# Patient Record
Sex: Male | Born: 1990 | Race: White | Hispanic: No | Marital: Single | Smoking: Never smoker
Health system: Southern US, Community
[De-identification: ages and names within clinical notes are randomized; demographics above are authoritative.]

## PROBLEM LIST (undated history)

## (undated) DIAGNOSIS — M2140 Flat foot [pes planus] (acquired), unspecified foot: Secondary | ICD-10-CM

## (undated) DIAGNOSIS — K219 Gastro-esophageal reflux disease without esophagitis: Secondary | ICD-10-CM

## (undated) DIAGNOSIS — F419 Anxiety disorder, unspecified: Secondary | ICD-10-CM

## (undated) HISTORY — DX: Anxiety disorder, unspecified: F41.9

## (undated) HISTORY — DX: Gastro-esophageal reflux disease without esophagitis: K21.9

## (undated) HISTORY — DX: Flat foot (pes planus) (acquired), unspecified foot: M21.40

---
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
## 2013-12-06 ENCOUNTER — Emergency Department: Payer: Self-pay | Admitting: Emergency Medicine

## 2014-10-30 ENCOUNTER — Other Ambulatory Visit: Payer: Self-pay | Admitting: Family Medicine

## 2014-10-30 DIAGNOSIS — K4090 Unilateral inguinal hernia, without obstruction or gangrene, not specified as recurrent: Secondary | ICD-10-CM

## 2014-11-08 ENCOUNTER — Ambulatory Visit
Admission: RE | Admit: 2014-11-08 | Discharge: 2014-11-08 | Disposition: A | Discharge status: Home / Self Care | Payer: BLUE CROSS/BLUE SHIELD | Source: Ambulatory Visit | Attending: Family Medicine | Admitting: Family Medicine

## 2014-11-08 DIAGNOSIS — R198 Other specified symptoms and signs involving the digestive system and abdomen: Secondary | ICD-10-CM | POA: Diagnosis present

## 2014-11-08 DIAGNOSIS — R1904 Left lower quadrant abdominal swelling, mass and lump: Secondary | ICD-10-CM | POA: Insufficient documentation

## 2014-11-08 DIAGNOSIS — K4090 Unilateral inguinal hernia, without obstruction or gangrene, not specified as recurrent: Secondary | ICD-10-CM

## 2015-03-01 ENCOUNTER — Encounter: Payer: Self-pay | Admitting: Urology

## 2015-03-01 ENCOUNTER — Ambulatory Visit (INDEPENDENT_AMBULATORY_CARE_PROVIDER_SITE_OTHER): Payer: BLUE CROSS/BLUE SHIELD | Admitting: Urology

## 2015-03-01 VITALS — BP 145/73 | HR 72 | Ht 70.0 in | Wt 210.6 lb

## 2015-03-01 DIAGNOSIS — N508 Other specified disorders of male genital organs: Secondary | ICD-10-CM

## 2015-03-01 DIAGNOSIS — N5089 Other specified disorders of the male genital organs: Secondary | ICD-10-CM

## 2015-03-01 NOTE — Progress Notes (Signed)
03/01/2015 9:21 AM   Arthur [name] [birth year] [id number]  Referring provider: Oswaldo Conroy, MD [address] [place], [place] [address]  Chief Complaint  Patient presents with  . Left groin pain and bulge     x 2 months referred by [name]    HPI: Patient is a 24 year old white male who is referred to [place] by his PCP, Dr. Hessie Diener, for left groin and bulge with a negative groin ultrasound for further evaluation.    He states that the groin pain and swelling started 3 months ago. He initially assumed it was a hernia, he states the bulge was first seen in the inguinal canal. Then, it is migrated down into the scrotum. The swelling increases as the day progresses and by  evening the swelling is very severe, but when he wakes up in the morning the swelling has abated. He has a burning sensation in the left groin on a continuous basis.  He's had some increase of urinary frequency, because he feels that this relieves the groin pain when he urinates.  Today, he feels the burn is present. He states that the swelling is not present at this time.  He has not had any gross hematuria, suprapubic pain or dysuria. He also denies any fevers, chills, nausea or vomiting.  PMH: Past Medical History  Diagnosis Date  . Acid reflux   . Anxiety     Surgical History: Past Surgical History  Procedure Laterality Date  . Ankle surgery  2010    Home Medications:    Medication List       This list is accurate as of: 03/01/15 11:59 PM.  Always use your most recent med list.               citalopram 40 MG tablet  Commonly known as:  CELEXA  Take 40 mg by mouth daily.     omeprazole 40 MG capsule  Commonly known as:  PRILOSEC  Take 40 mg by mouth daily.        Allergies: No Known Allergies  Family History: Family History  Problem Relation Age of Onset  . Prostate cancer Paternal Grandfather   . Kidney disease Neg Hx   . Kidney Stones      Social  History:  reports that he has never smoked. He does not have any smokeless tobacco history on file. He reports that he drinks alcohol. He reports that he uses illicit drugs.  ROS: UROLOGY Frequent Urination?: No Hard to postpone urination?: No Burning/pain with urination?: No Get up at night to urinate?: No Leakage of urine?: No Urine stream starts and stops?: No Trouble starting stream?: Yes Do you have to strain to urinate?: No Blood in urine?: No Urinary tract infection?: No Sexually transmitted disease?: No Injury to kidneys or bladder?: No Painful intercourse?: Yes Weak stream?: No Erection problems?: No Penile pain?: No  Gastrointestinal Nausea?: No Vomiting?: No Indigestion/heartburn?: No Diarrhea?: Yes Constipation?: No  Constitutional Fever: No Night sweats?: No Weight loss?: No Fatigue?: No  Skin Skin rash/lesions?: No Itching?: No  Eyes Blurred vision?: No Double vision?: No  Ears/Nose/Throat Sore throat?: No Sinus problems?: No  Hematologic/Lymphatic Swollen glands?: No Easy bruising?: No  Cardiovascular Leg swelling?: No Chest pain?: Yes  Respiratory Cough?: No Shortness of breath?: No  Endocrine Excessive thirst?: No  Musculoskeletal Back pain?: No Joint pain?: No  Neurological Headaches?: No Dizziness?: No  Psychologic Depression?: No Anxiety?: Yes  Physical Exam: BP 145/73 mmHg  Pulse 72  Ht  (1.778 m)  Wt 210 lb 9.6 oz (95.528 kg)  BMI 30.22 kg/m2  Constitutional:  Alert and oriented, No acute distress. HEENT: [place] AT, moist mucus membranes.  Trachea midline, no masses. Cardiovascular: No clubbing, cyanosis, or edema. Respiratory: Normal respiratory effort, no increased work of breathing. GI: Abdomen is soft, nontender, nondistended, no abdominal masses GU: No CVA tenderness. GU: Patient with circumcised phallus.  Urethral meatus is patent.  No penile discharge. No penile lesions or rashes. Scrotum without  lesions, cysts, rashes and/or edema.  Testicles are located scrotally bilaterally. No masses are appreciated in the testicles. Left and right epididymis are normal.  I could not appreciate a hernia at this time. Skin: No rashes, bruises or suspicious lesions. Lymph: No cervical or inguinal adenopathy. Neurologic: Grossly intact, no focal deficits, moving all 4 extremities. Psychiatric: Normal mood and affect.  Laboratory Data:  Pertinent Imaging: CLINICAL DATA: Evaluate for left inguinal hernia ; left groin pain for 6 weeks ago with intermittent bulging of the soft tissues in this region.  EXAM: ULTRASOUND left LOWER EXTREMITY LIMITED, groin region  TECHNIQUE: Ultrasound examination of the left groin soft tissues was performed in the area of clinical concern.  COMPARISON: None  FINDINGS: Interrogation of the left groin in the region of the inguinal crease reveals no cystic or solid mass. No peristalsis in bowel was observed.  IMPRESSION: There is no inguinal hernia demonstrated on this directed ultrasound exam.   Electronically Signed  By: David Swaziland M.D.  On: 11/08/2014 17:15        Assessment & Plan:    1. Scrotal swelling:   Patient is very frustrated that I could not feel any abnormalities on today exam.  I explained to him that it would be best if we could obtain a scrotal ultrasound when he had the swelling.  I will schedule the ultrasound in the late afternoon to increase our chances of the patient having the swelling.  I stated it could be a hydrocele, varicocele or a hernia.  I explained to the patient that he could shine a flashlight on his scrotum when he has the swelling and see if it transluminates.  This would inidicate a hydrocele.    - [place] Art/Ven Flow Abd Pelv Doppler; Future - US Scrotum; Future   Return for Scrotal ultrasound report.  Michiel Cowboy, PA-C  [facility] [address], [address] [place], [place] [address] [phone]

## 2015-03-02 DIAGNOSIS — N5089 Other specified disorders of the male genital organs: Secondary | ICD-10-CM | POA: Insufficient documentation

## 2015-03-08 ENCOUNTER — Ambulatory Visit
Admission: RE | Admit: 2015-03-08 | Discharge: 2015-03-08 | Disposition: A | Discharge status: Home / Self Care | Payer: BLUE CROSS/BLUE SHIELD | Source: Ambulatory Visit | Attending: Urology | Admitting: Urology

## 2015-03-08 DIAGNOSIS — N508 Other specified disorders of male genital organs: Secondary | ICD-10-CM | POA: Diagnosis present

## 2015-03-08 DIAGNOSIS — N5089 Other specified disorders of the male genital organs: Secondary | ICD-10-CM

## 2015-03-13 ENCOUNTER — Encounter: Payer: Self-pay | Admitting: Urology

## 2015-03-13 ENCOUNTER — Ambulatory Visit: Payer: BLUE CROSS/BLUE SHIELD | Admitting: Urology

## 2015-06-05 ENCOUNTER — Encounter: Payer: Self-pay | Admitting: Emergency Medicine

## 2015-06-05 DIAGNOSIS — Z79899 Other long term (current) drug therapy: Secondary | ICD-10-CM | POA: Diagnosis not present

## 2015-06-05 DIAGNOSIS — R1904 Left lower quadrant abdominal swelling, mass and lump: Secondary | ICD-10-CM | POA: Diagnosis present

## 2015-06-05 DIAGNOSIS — K4090 Unilateral inguinal hernia, without obstruction or gangrene, not specified as recurrent: Secondary | ICD-10-CM | POA: Insufficient documentation

## 2015-06-05 NOTE — ED Notes (Signed)
Pt presents to ED with painless swelling to the left side of his scrotum and reports he feels a hard "mass" inside. Pt reports this has been an ongoing issue and has been seen by his pcp in addition to having 2 ultrasounds which have been inconclusive. Pt reports swelling used to be intermittent but the past month or so has become constant. Pt was instructed to come to ED when swelling returned.

## 2015-06-06 ENCOUNTER — Emergency Department
Admission: EM | Admit: 2015-06-06 | Discharge: 2015-06-06 | Disposition: A | Discharge status: Home / Self Care | Payer: BLUE CROSS/BLUE SHIELD | Attending: Emergency Medicine | Admitting: Emergency Medicine

## 2015-06-06 DIAGNOSIS — K4090 Unilateral inguinal hernia, without obstruction or gangrene, not specified as recurrent: Secondary | ICD-10-CM

## 2015-06-06 NOTE — ED Provider Notes (Signed)
[name] Provider Note  ____________________________________________  Time seen: 3:20 AM  I have reviewed the triage vital signs and the nursing notes.   HISTORY  Chief Complaint Groin Swelling and Mass      HPI Arthur [name] is a 24 y.o. male  Presents with left groin swelling which she is noted for several months and has been seen multiple times for the same. Patient denies any testicular pain no fever no dysuria no nausea vomiting or diarrhea.      Past Medical History  Diagnosis Date  . Acid reflux   . Anxiety     Patient Active Problem List   Diagnosis Date Noted  . Scrotal swelling 03/02/2015    Past Surgical History  Procedure Laterality Date  . Ankle surgery  2010    Current Outpatient Rx  Name  Route  Sig  Dispense  Refill  . citalopram (CELEXA) 40 MG tablet   Oral   Take 40 mg by mouth daily.         Marland Kitchen omeprazole (PRILOSEC) 40 MG capsule   Oral   Take 40 mg by mouth daily.           Allergies Review of patient's allergies indicates no known allergies.  Family History  Problem Relation Age of Onset  . Prostate cancer Paternal Grandfather   . Kidney disease Neg Hx   . Kidney Stones      Social History Social History  Substance Use Topics  . Smoking status: Never Smoker   . Smokeless tobacco: None  . Alcohol Use: 0.0 oz/week    0 Standard drinks or equivalent per week    Review of Systems  Constitutional: Negative for fever. Eyes: Negative for visual changes. ENT: Negative for sore throat. Cardiovascular: Negative for chest pain. Respiratory: Negative for shortness of breath. Gastrointestinal: Negative for abdominal pain, vomiting and diarrhea. Genitourinary: Negative for dysuria. Positive for left groin swelling Musculoskeletal: Negative for back pain. Skin: Negative for rash. Neurological: Negative for headaches, focal weakness or numbness.  10-point ROS otherwise  negative.  ____________________________________________   PHYSICAL EXAM:  VITAL SIGNS: ED Triage Vitals  Enc Vitals Group     BP 06/05/15 2312 125/68 mmHg     Pulse Rate 06/05/15 2312 86     Resp 06/06/15 0320 16     Temp 06/05/15 2312 97.8 F (36.6 C)     Temp Source 06/05/15 2312 Oral     SpO2 06/05/15 2312 96 %     Weight 06/05/15 2312 210 lb (95.255 kg)     Height 06/05/15 2312  (1.778 m)     Head Cir --      Peak Flow --      Pain Score 06/05/15 2312 0     Pain Loc --      Pain Edu? --      Excl. in GC? --      Constitutional: Alert and oriented. Well appearing and in no distress. Eyes: Conjunctivae are normal. PERRL. Normal extraocular movements. ENT   Head: Normocephalic and atraumatic.   Nose: No congestion/rhinnorhea.   Mouth/Throat: Mucous membranes are moist.   Neck: No stridor. Hematological/Lymphatic/Immunilogical: No cervical lymphadenopathy. Cardiovascular: Normal rate, regular rhythm. Normal and symmetric distal pulses are present in all extremities. No murmurs, rubs, or gallops. Respiratory: Normal respiratory effort without tachypnea nor retractions. Breath sounds are clear and equal bilaterally. No wheezes/rales/rhonchi. Gastrointestinal: Soft and nontender. No distention. There is no CVA tenderness. Left  inguinal hernia appreciated on exam easily reduced Genitourinary: deferred Musculoskeletal: Nontender with normal range of motion in all extremities. No joint effusions.  No lower extremity tenderness nor edema. Neurologic:  Normal speech and language. No gross focal neurologic deficits are appreciated. Speech is normal.  Skin:  Skin is warm, dry and intact. No rash noted. Psychiatric: Mood and affect are normal. Speech and behavior are normal. Patient exhibits appropriate insight and judgment.     INITIAL IMPRESSION / ASSESSMENT AND PLAN / ED COURSE  Pertinent labs & imaging results that were available during my care of the  patient were reviewed by me and considered in my medical decision making (see chart for details).  History of physical exam consistent with left inguinal hernia such patient will be referred to Dr. Orvis BrillLoflin general surgeon on-call for further evaluation and management on the outpatient setting.  ____________________________________________   FINAL CLINICAL IMPRESSION(S) / ED DIAGNOSES  Final diagnoses:  Left inguinal hernia      Arthur Currentandolph N Brown, MD 06/06/15 0401

## 2015-06-06 NOTE — ED Notes (Signed)
Pt reports swelling/mass to left groin area.  Pt states sx for several months.  Pt has had multiple u/s and reports negative results.  No diff urinating. No penile discharge.   No back pain.  No n/v/d.

## 2015-06-06 NOTE — ED Notes (Signed)
Dr. Brown at bedside to eval.

## 2015-06-06 NOTE — Discharge Instructions (Signed)

## 2015-12-18 ENCOUNTER — Other Ambulatory Visit: Payer: Self-pay | Admitting: Family Medicine

## 2015-12-18 DIAGNOSIS — F419 Anxiety disorder, unspecified: Secondary | ICD-10-CM | POA: Insufficient documentation

## 2015-12-18 DIAGNOSIS — N5082 Scrotal pain: Secondary | ICD-10-CM

## 2015-12-26 ENCOUNTER — Ambulatory Visit: Admission: RE | Admit: 2015-12-26 | Payer: BLUE CROSS/BLUE SHIELD | Source: Ambulatory Visit

## 2016-01-02 ENCOUNTER — Ambulatory Visit: Admission: RE | Admit: 2016-01-02 | Payer: BLUE CROSS/BLUE SHIELD | Source: Ambulatory Visit

## 2016-08-19 IMAGING — US US EXTREM LOW*L* LIMITED
1 series · 14 of 18 positions shown · non-contrast
Comparison: None

CLINICAL DATA: Evaluate for left inguinal hernia ; left groin pain
for 6 weeks ago with intermittent bulging of the soft tissues in
this region.

EXAM:
ULTRASOUND left LOWER EXTREMITY LIMITED, groin region
TECHNIQUE: Ultrasound examination of the left groin soft tissues was performed
in the area of clinical concern.

[Series 1: us extrem low*left* limited · 0.08mm/px · 14 of 18 slices shown]
[im 1/18]
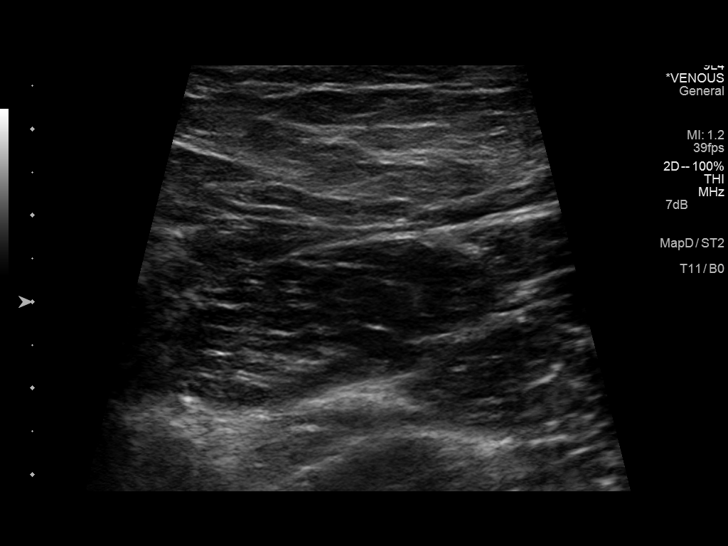
[im 2/18]
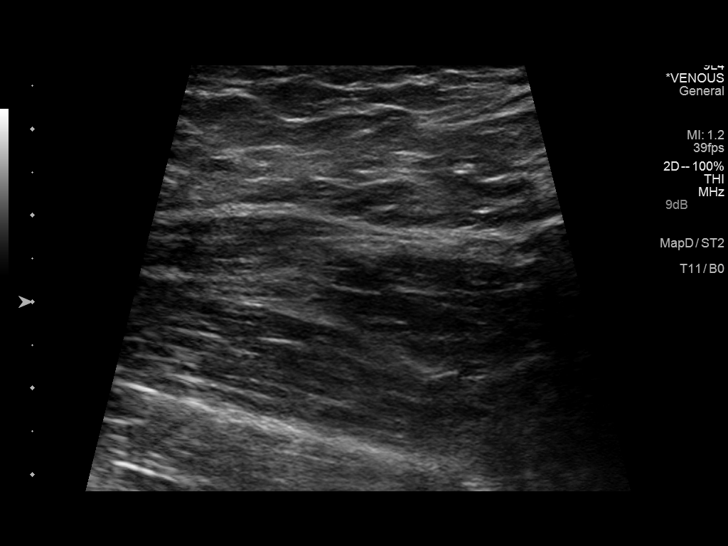
[im 4/18]
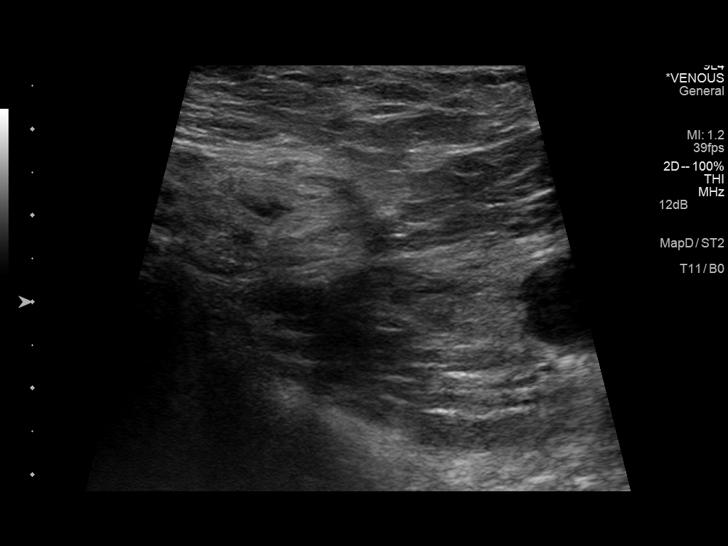
[im 5/18]
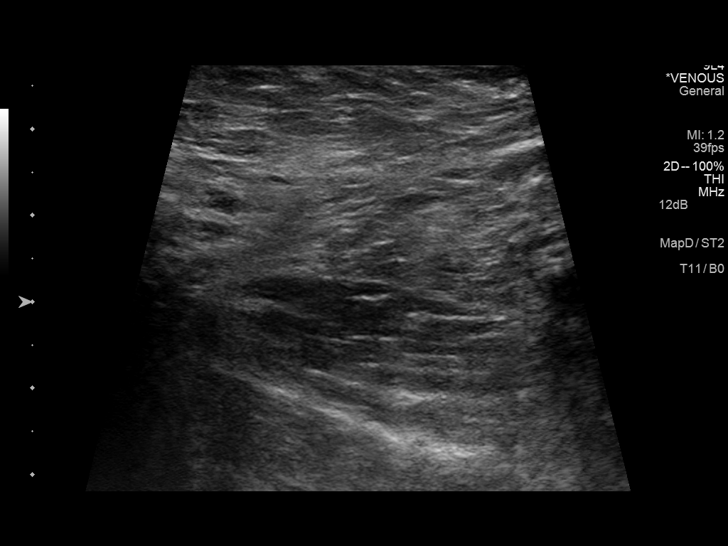
[im 6/18]
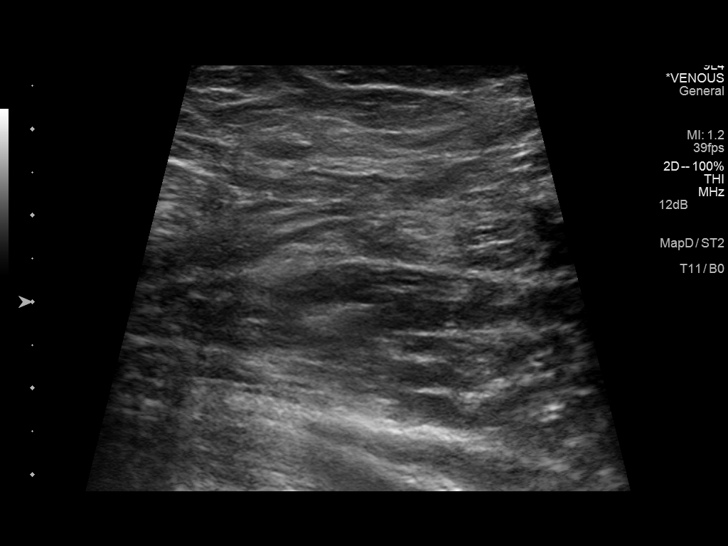
[im 8/18]
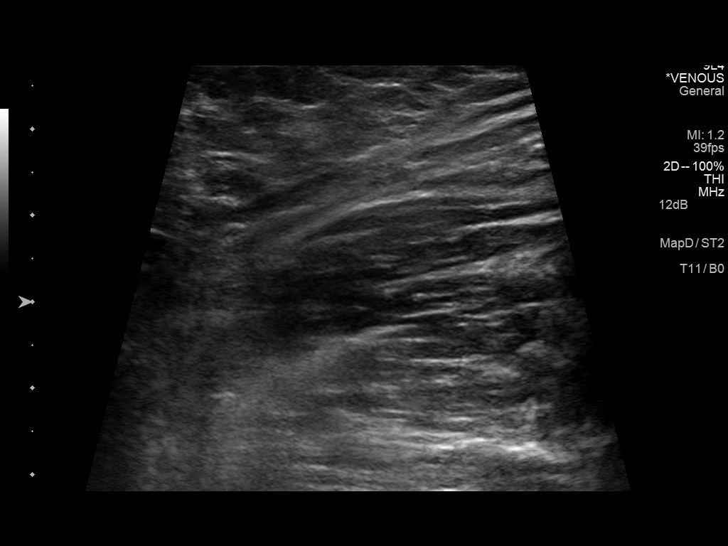
[im 9/18]
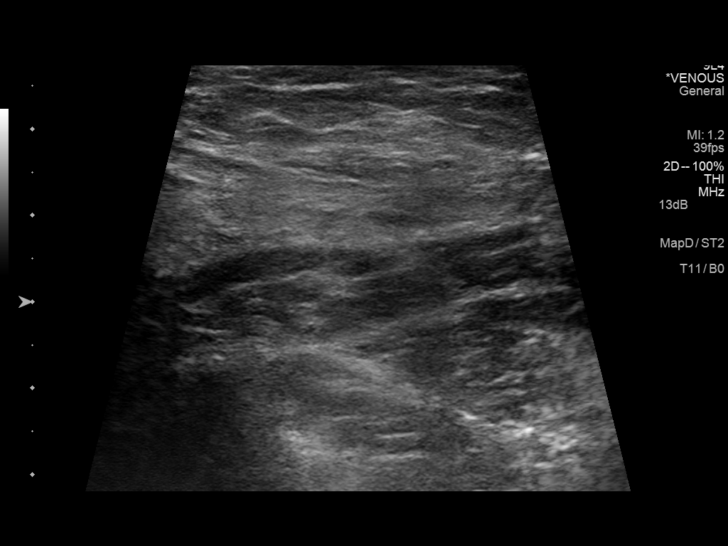
[im 10/18]
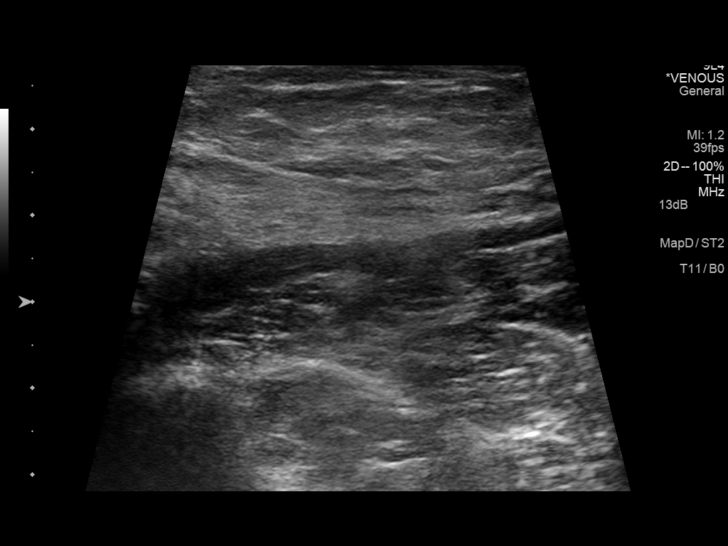
[im 11/18]
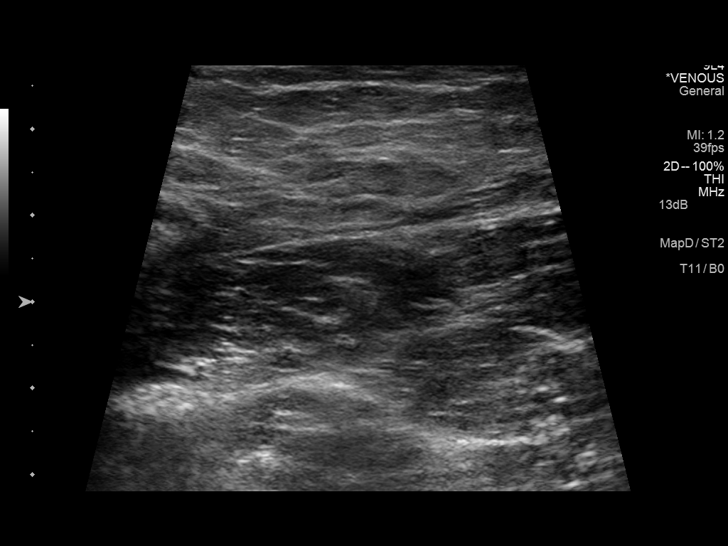
[im 13/18]
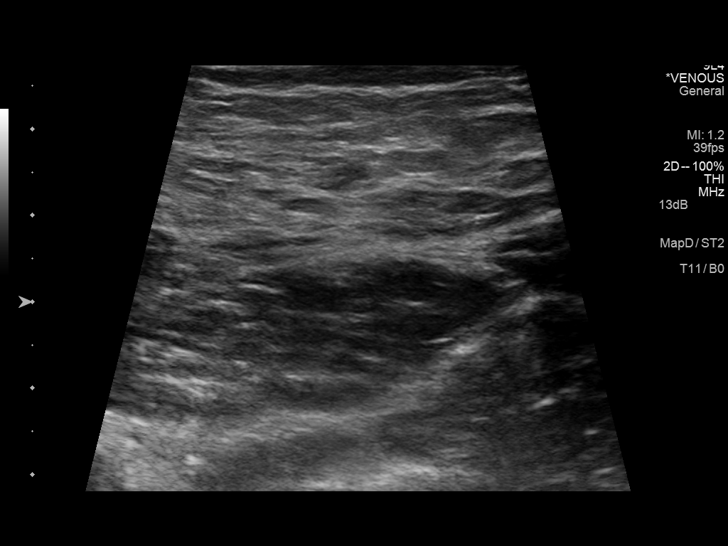
[im 14/18]
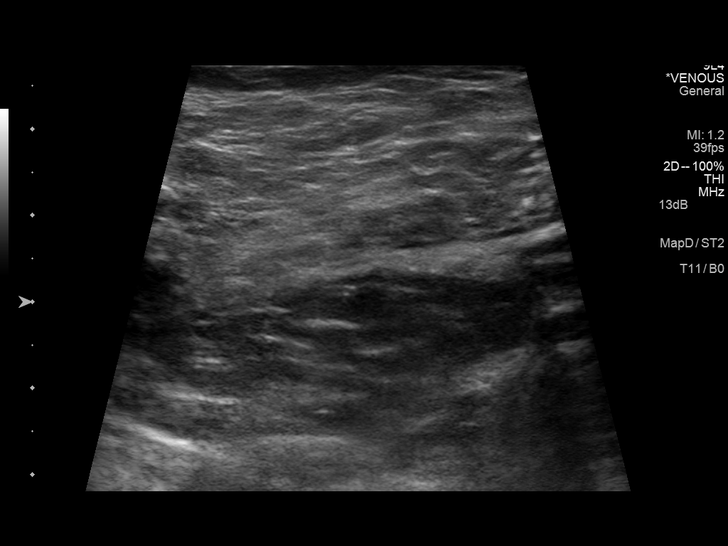
[im 15/18]
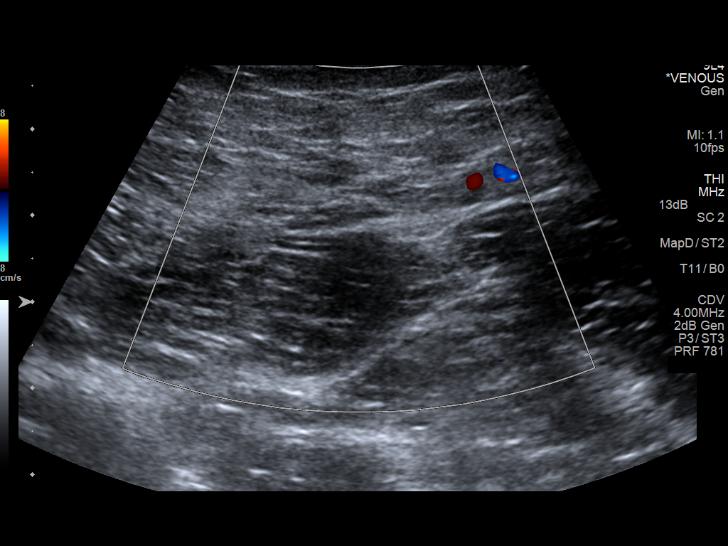
[im 17/18]
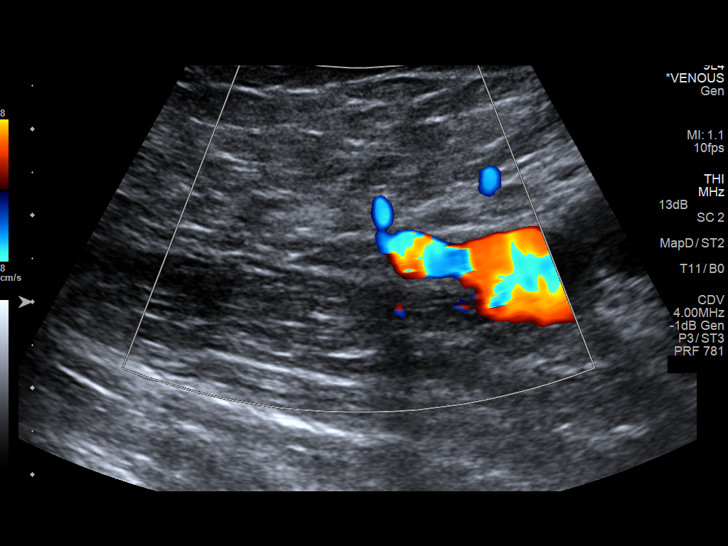
[im 18/18]
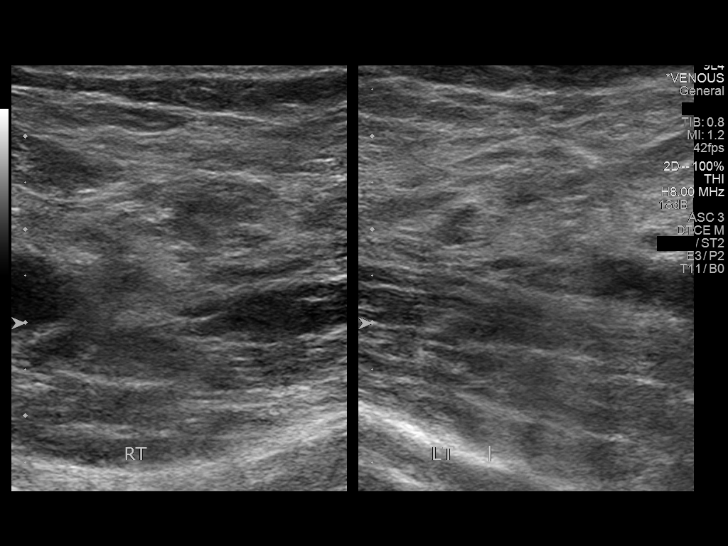

[14 of 18 positions shown; findings below may reference images not displayed]

FINDINGS: Interrogation of the left groin in the region of the inguinal crease
reveals no cystic or solid mass. No peristalsis in bowel was
observed.
IMPRESSION: There is no inguinal hernia demonstrated on this directed ultrasound
exam.

## 2016-12-17 IMAGING — US US ART/VEN ABD/PELV/SCROTUM DOPPLER LTD
1 series · 13 of 25 positions shown · non-contrast
Comparison: Left inguinal hernia evaluation by ultrasound [DATE]

CLINICAL DATA: Left scrotal swelling for the past 3 months, burning
sensation

EXAM:
SCROTAL ULTRASOUND
DOPPLER ULTRASOUND OF THE TESTICLES
TECHNIQUE: Complete ultrasound examination of the testicles, epididymis, and
other scrotal structures was performed. Color and spectral Doppler
ultrasound were also utilized to evaluate blood flow to the
testicles.

[Series 1: us art/ven abd/pelv/scrotum doppler ltd · 0.08mm/px · 13 of 73 slices shown]
[im 1/73]
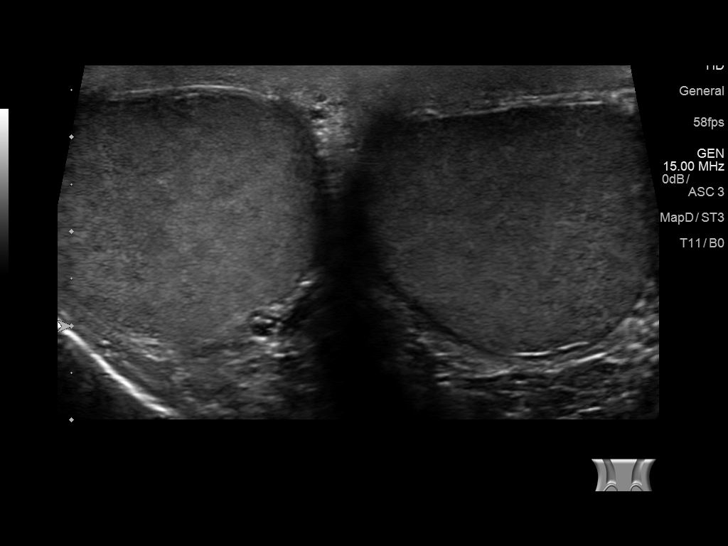
[im 7/73]
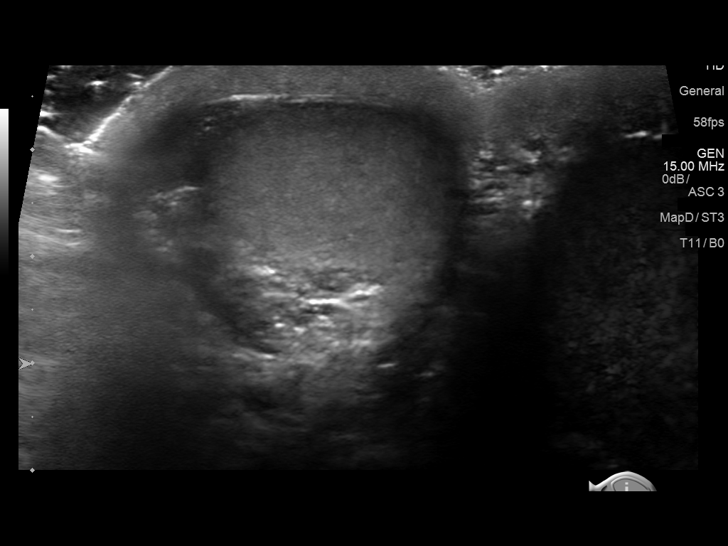
[im 13/73]
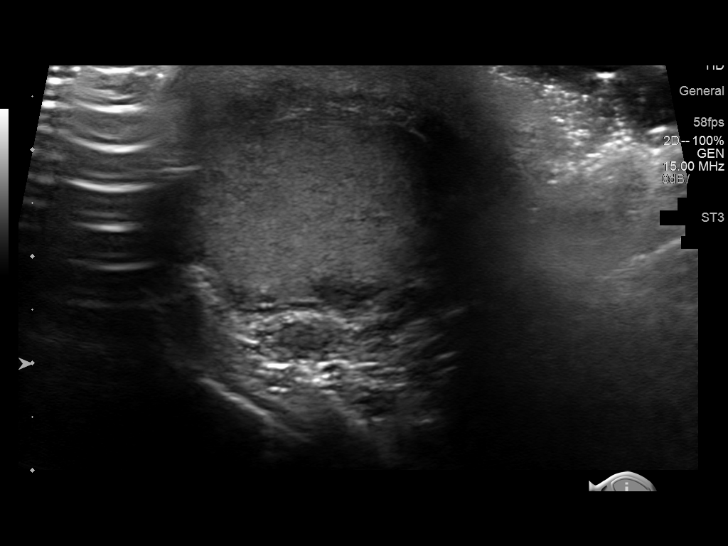
[im 19/73]
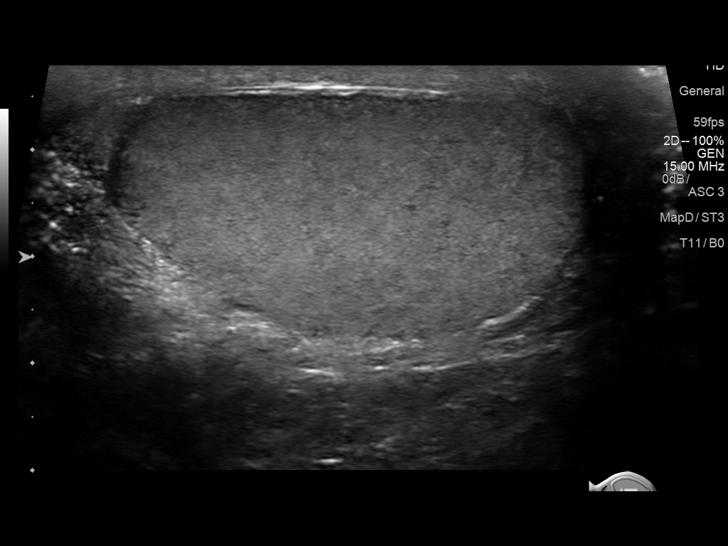
[im 25/73]
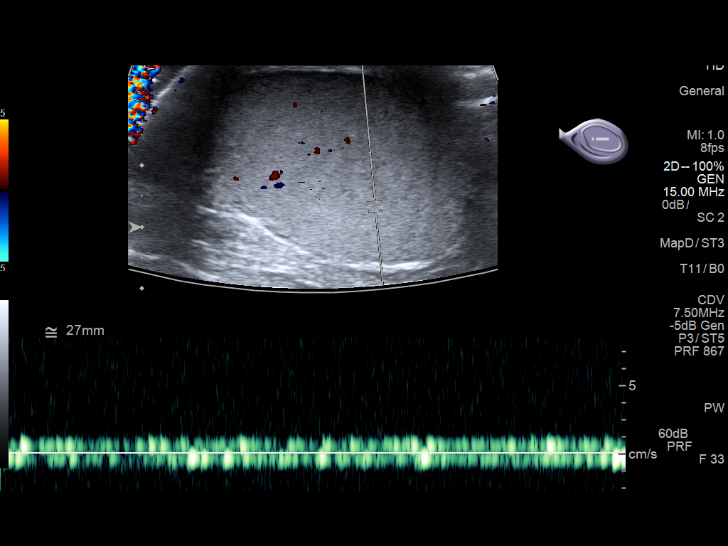
[im 31/73]
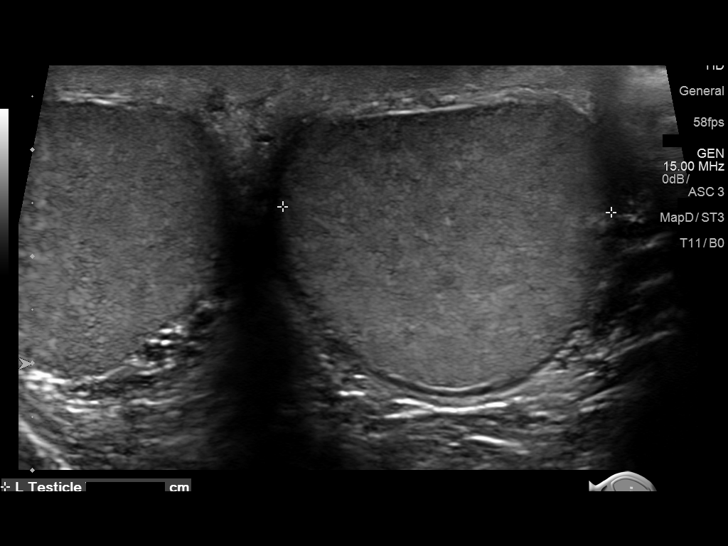
[im 37/73]
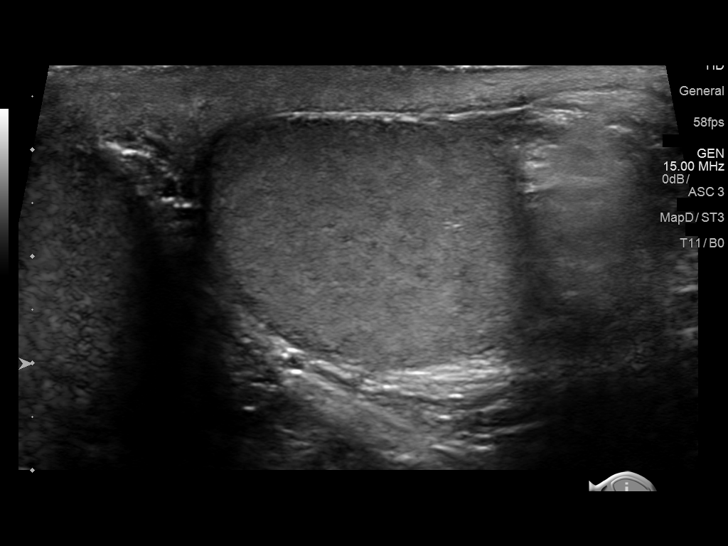
[im 43/73]
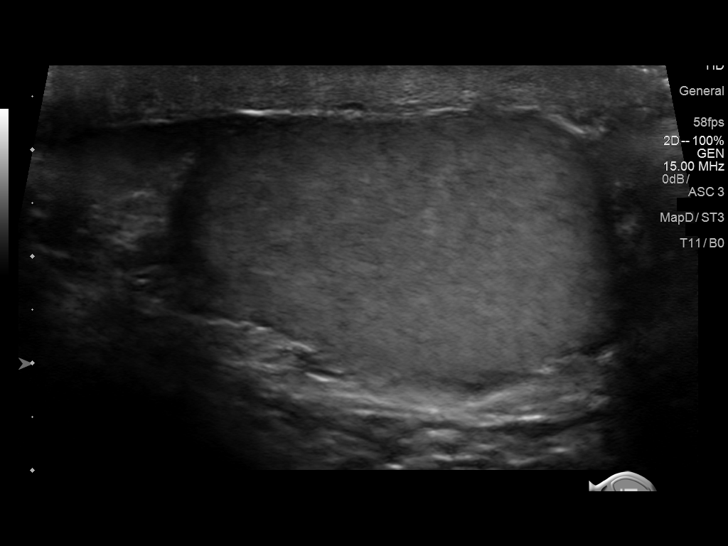
[im 49/73]
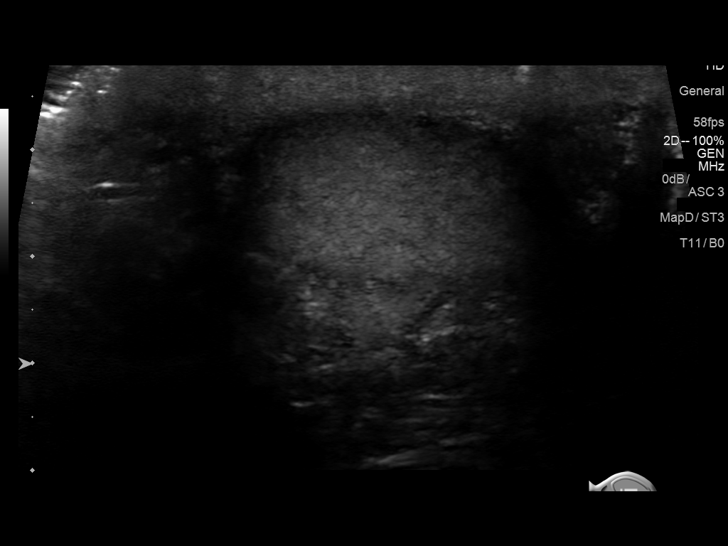
[im 55/73]
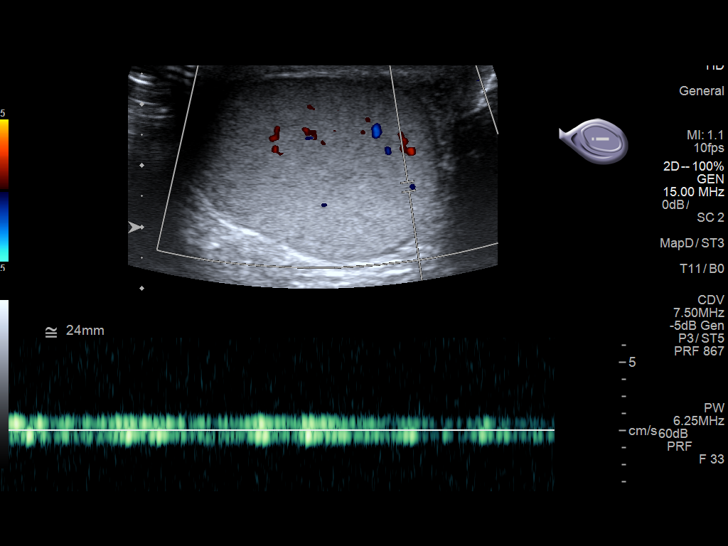
[im 61/73]
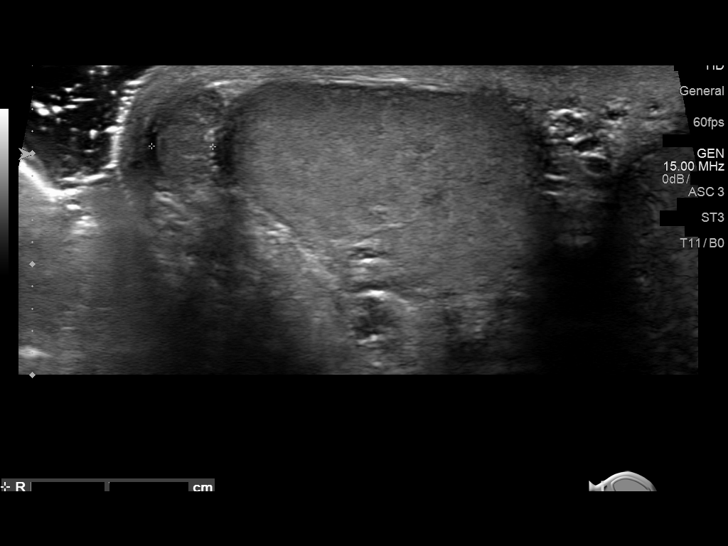
[im 67/73]
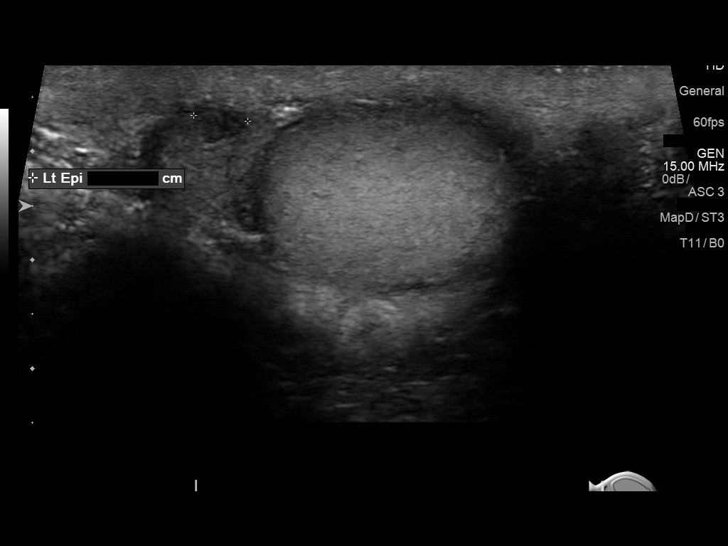
[im 73/73]
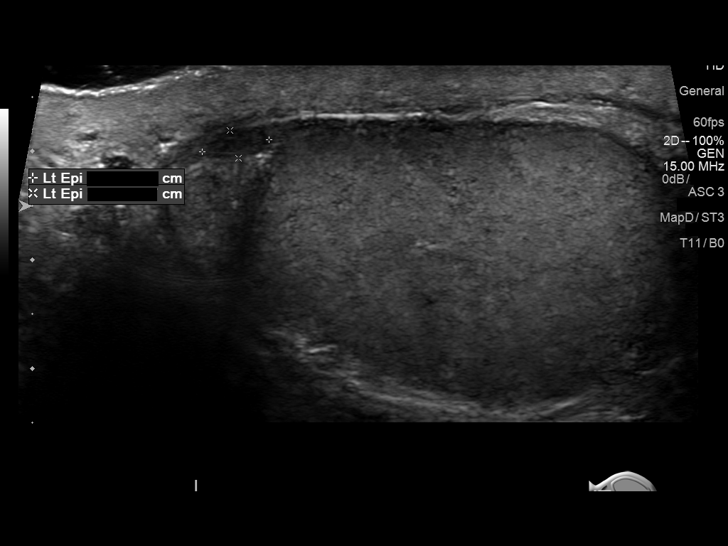

[13 of 25 positions shown; findings below may reference images not displayed]

FINDINGS: Right testicle

Measurements: 4.6 x 2.8 x 3.3 cm. No mass or microlithiasis
visualized.

Left testicle

Measurements: 4.4 x 2.5 x 3.1 cm. No mass or microlithiasis
visualized.

Right epididymis:  Normal in size and appearance.

Left epididymis: The epididymis on the left exhibits a heterogeneous
echotexture with an area of decreased echogenicity likely reflecting
a 6 x 5 x 3 mm complex cyst. It is not hypervascular

Hydrocele:  None visualized.

Varicocele:  None visualized.

Pulsed Doppler interrogation of both testes demonstrates normal low
resistance arterial and venous waveforms bilaterally.
IMPRESSION: 1. There is no evidence of a testicular mass, torsion, nor
inflammatory process.
2. Heterogeneous echotexture of the left epididymis. It does not
appear hypervascular however. This could reflect a low-level
inflammatory process. Is there a history of trauma?
3. There is no varicocele nor hydrocele.

## 2019-08-26 ENCOUNTER — Ambulatory Visit (INDEPENDENT_AMBULATORY_CARE_PROVIDER_SITE_OTHER): Payer: Commercial Managed Care - PPO

## 2019-08-26 ENCOUNTER — Ambulatory Visit (INDEPENDENT_AMBULATORY_CARE_PROVIDER_SITE_OTHER): Payer: Commercial Managed Care - PPO | Admitting: Podiatry

## 2019-08-26 ENCOUNTER — Other Ambulatory Visit: Payer: Self-pay | Admitting: Podiatry

## 2019-08-26 ENCOUNTER — Ambulatory Visit: Payer: Commercial Managed Care - PPO

## 2019-08-26 ENCOUNTER — Other Ambulatory Visit: Payer: Self-pay

## 2019-08-26 DIAGNOSIS — M2141 Flat foot [pes planus] (acquired), right foot: Secondary | ICD-10-CM

## 2019-08-26 DIAGNOSIS — M79671 Pain in right foot: Secondary | ICD-10-CM | POA: Diagnosis not present

## 2019-08-26 DIAGNOSIS — M2142 Flat foot [pes planus] (acquired), left foot: Secondary | ICD-10-CM

## 2019-08-26 DIAGNOSIS — M25371 Other instability, right ankle: Secondary | ICD-10-CM

## 2019-08-26 MED ORDER — AMMONIUM LACTATE 12 % EX LOTN: 1.0000 "application " | TOPICAL_LOTION | CUTANEOUS | 0 refills | Status: AC | PRN

## 2019-08-30 ENCOUNTER — Telehealth: Payer: Self-pay | Admitting: Podiatry

## 2019-08-30 ENCOUNTER — Ambulatory Visit: Payer: Commercial Managed Care - PPO | Admitting: Podiatry

## 2019-08-30 ENCOUNTER — Other Ambulatory Visit: Payer: Self-pay

## 2019-08-30 ENCOUNTER — Encounter: Payer: Self-pay | Admitting: Podiatry

## 2019-08-30 DIAGNOSIS — M2142 Flat foot [pes planus] (acquired), left foot: Secondary | ICD-10-CM

## 2019-08-30 DIAGNOSIS — M2140 Flat foot [pes planus] (acquired), unspecified foot: Secondary | ICD-10-CM

## 2019-08-30 DIAGNOSIS — M2141 Flat foot [pes planus] (acquired), right foot: Secondary | ICD-10-CM | POA: Diagnosis not present

## 2019-08-30 DIAGNOSIS — M25371 Other instability, right ankle: Secondary | ICD-10-CM | POA: Diagnosis not present

## 2019-08-30 MED ORDER — MELOXICAM 15 MG PO TABS: 15.0000 mg | ORAL_TABLET | Freq: Every day | ORAL | 3 refills | Status: DC

## 2019-08-30 MED ORDER — METHYLPREDNISOLONE 4 MG PO TBPK: ORAL_TABLET | ORAL | 0 refills | Status: DC

## 2019-08-30 MED ORDER — CYCLOBENZAPRINE HCL 10 MG PO TABS: 10.0000 mg | ORAL_TABLET | Freq: Three times a day (TID) | ORAL | 0 refills | Status: DC | PRN

## 2019-08-30 NOTE — Telephone Encounter (Signed)
It was already sent to that pharmacy.

## 2019-08-30 NOTE — Addendum Note (Signed)
Addended by: Alphia Kava D on: 08/30/2019 04:23 PM   Modules accepted: Orders

## 2019-08-30 NOTE — Telephone Encounter (Signed)
I looked it wrong. I thought I did send it to the [facility] [address] initially... its fixed now!!

## 2019-08-30 NOTE — Telephone Encounter (Signed)
I informed pt the medications had been reordered with the [facility]87.

## 2019-08-30 NOTE — Progress Notes (Signed)
He presents today after seeing Dr. Allena Katz last week.  He has a history of flatfoot which we corrected to a very satisfactory alignment back in 2008.  He is now having pain all over the right foot and in the lower leg.  He denies any trauma.  Objective: Vital signs are stable he is alert and oriented x3.  Evaluation of the right lower extremity demonstrates an osseously mature individual with collapse of the medial longitudinal arch particularly the rear foot and plantarflexion of the first metatarsal with eversion of the foot.  Once we try to correct this he is pretty much in a spasm along the peroneals and exquisitely painful.  Assessment: Corrected rear foot deformity and midfoot deformity when he was younger and is now developed peroneal spastic flatfoot which may have been overlooked previously or did not exist at the time.  Plan: Discussed etiology pathology conservative or surgical therapies at this point were going to request neurology to perform Botox injections along the peroneal muscles to help relax those muscles so that he could pull this foot back up underneath him.  He also has a very tight gastroc muscle which they may inject as well.  If this works for him that we need to see about getting him into a new orthotic.  Otherwise it will be reconstruction which I will send to Dr.Evans to reconstruct his rear foot and midfoot.  Most likely will have to disable the peroneals at that time.  I am also putting him on Flexeril to help with the tension in the legs starting on methylprednisolone to be followed by meloxicam.  I will follow-up with him once neurology has completed their injection therapy.

## 2019-08-30 NOTE — Progress Notes (Signed)
Subjective:  Patient ID: Arthur [name], male    DOB: 09/15/1990,  MRN: [id number]  Chief Complaint  Patient presents with  . Foot Problem    Pt states having pain from walking on outside of foot. Pt states he had surgery done by Dr. Milinda Pointer in 2008 but is unsure of the name of the procedure or his diagnosis. Pt states he believes the surgery was not effective and he still has pain.    29 y.o. male presents with the above complaint.  Patient presents with follow-up from flatfoot reconstruction that was done by Dr. Milinda Pointer in 2008 to the right foot.  Patient states that there is still some pain associated in the arch of the foot and it appears clinically that the foot has become flat.  He states that he has started walking differently which causing him a lot of pain.  He states that he has instability in the ankle that is causing him to have antalgic gait and is making it harder for him to work/walk.  He states most of his pain is in the ankle as well as in the arch of the foot.  He also has a secondary complaint of xerosis with fissure right side of the foot due to constant stress in the shoe given that he is rolling his foot inward.  Patient has tried applying creams which has not helped.  He has not tried anything else.  He is following up because he would like to know if there is anything else that could be done to decrease his pain.   Review of Systems: Negative except as noted in the HPI. Denies N/V/F/Ch.  Past Medical History:  Diagnosis Date  . Acid reflux   . Anxiety     Current Outpatient Medications:  .  cyclobenzaprine (FLEXERIL) 10 MG tablet, Take 10 mg by mouth at bedtime., Disp: , Rfl:  .  promethazine (PHENERGAN) 25 MG tablet, Take 25 mg by mouth every 6 (six) hours as needed., Disp: , Rfl:  .  ammonium lactate (AMLACTIN) 12 % lotion, Apply 1 application topically as needed for dry skin., Disp: 400 g, Rfl: 0  Social History   Tobacco Use  Smoking Status Never Smoker    No  Known Allergies Objective:  There were no vitals filed for this visit. There is no height or weight on file to calculate BMI. Constitutional Well developed. Well nourished.  Vascular Dorsalis pedis pulses palpable bilaterally. Posterior tibial pulses palpable bilaterally. Capillary refill normal to all digits.  No cyanosis or clubbing noted. Pedal hair growth normal.  Neurologic Normal speech. Oriented to person, place, and time. Epicritic sensation to light touch grossly present bilaterally.  Dermatologic Nails well groomed and normal in appearance. No open wounds. No skin lesions.  Orthopedic:  Pain on palpation to the arch of th right  foot.  Unable to recreate the arch with dorsiflexion of the hallux.  Unable to reduce single heel raise.  Upon double heel raise patient the calcaneus is still in a valgus position.  No prominence of hardware noted.  Too many toe sign present upon gait examination.  Calcaneovalgus noted upon full weightbearing.  Previous surgical scars noted.   Radiographs: 3 views of skeletally mature adult foot and ankle right: There is no arthritic changes noted at the ankle joint.  Good congruency noted across the ankle joint.  Hardware is intact.  Calcaneus appears to be in a rectus position and Calc axial view.  There is anterior break in  the cyma line.  There is decreasing calcaneal inclination angle increase in talar declination angle increase in Mearys angle. Assessment:   1. Foot pain, right    Plan:  Patient was evaluated and treated and all questions answered.  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  Given that patient is having chronic ankle instability on the medial aspect of the foot/ankle I believe patient will benefit from a Tri-Lock ankle brace to hold the stability of the ankle  joint. -Radiographically appears that patient has had a medial calcaneal slide procedure however I am not able to tell if he has had any other adjunctive procedures done. -I believe patient will benefit from seeing Dr. Al Corpus for follow-up given that he has a history with the patient. -I will also discuss this patient with Dr. Al Corpus.   Right medial foot xerosis with fissuring -I explained to the patient the etiology of fissuring likely due to pressure from the shoes constantly rubbing up against the medial aspect of the foot.  I believe patient will benefit from ammonium lactate to help with xerosis/fissuring. -Ammonium lactate was sent to the pharmacy.   No follow-ups on file.

## 2019-08-30 NOTE — Telephone Encounter (Signed)
Pt called stating that he wanted his medictions sent to [facility] [address] in [place]

## 2019-08-31 ENCOUNTER — Telehealth: Payer: Self-pay | Admitting: *Deleted

## 2019-08-31 DIAGNOSIS — M25371 Other instability, right ankle: Secondary | ICD-10-CM

## 2019-08-31 DIAGNOSIS — M79671 Pain in right foot: Secondary | ICD-10-CM

## 2019-08-31 DIAGNOSIS — M2141 Flat foot [pes planus] (acquired), right foot: Secondary | ICD-10-CM

## 2019-08-31 DIAGNOSIS — M2140 Flat foot [pes planus] (acquired), unspecified foot: Secondary | ICD-10-CM

## 2019-08-31 NOTE — Telephone Encounter (Signed)
Required referral form, clinicals and demographics faxed to [facility]. 

## 2019-08-31 NOTE — Telephone Encounter (Signed)
-----   Message from Kristian Covey, [facility] sent at 08/30/2019  4:24 PM EDT ----- Regarding: Referral to Neurologist for Botox Referral to Neurologist - Dr. 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

## 2019-10-11 ENCOUNTER — Encounter: Payer: Self-pay | Admitting: *Deleted

## 2019-10-13 ENCOUNTER — Ambulatory Visit: Payer: Commercial Managed Care - PPO | Admitting: Neurology

## 2019-10-13 ENCOUNTER — Other Ambulatory Visit: Payer: Self-pay

## 2019-10-13 ENCOUNTER — Encounter: Payer: Self-pay | Admitting: Neurology

## 2019-10-13 VITALS — BP 142/87 | HR 87 | Temp 97.2°F | Ht 70.0 in | Wt 246.3 lb

## 2019-10-13 DIAGNOSIS — R531 Weakness: Secondary | ICD-10-CM

## 2019-10-13 DIAGNOSIS — R269 Unspecified abnormalities of gait and mobility: Secondary | ICD-10-CM

## 2019-10-13 DIAGNOSIS — R292 Abnormal reflex: Secondary | ICD-10-CM | POA: Diagnosis not present

## 2019-10-13 NOTE — Progress Notes (Signed)
PATIENT: Arthur [name] DOB: 1990-11-21  Chief Complaint  Patient presents with  . Botox Evaluation    here for evaluation for botox - peroneal spacity L and R foot   . Referring provider    Max Milinda Pointer--- DPM     HISTORICAL  Arthur [name] is a 29 year old male, seen in request by podiatrist Dr. Milinda Pointer, Max T for evaluation of bilateral foot pain, history of right foot surgery, potential botulism toxin injection for spasticity and pain of foot, initial evaluation was on Oct 13, 2019.  I have reviewed and summarized the referring note from the referring physician.  He was born full-term, developmentally normal, was noted to have flatfeet since childhood, by age 75, he began to complains of pain when bearing weight, eventually underwent to right Achilles tendon and right foot surgery, which failed to help his symptoms, actually he experienced more right foot pain when bearing weight, especially morning times, it is hard for him to put weight on his foot, he has to gradually with few tries to adapt to his walking, he denies noticeable sensory loss, but did complains of difficulty making a tight grip as long as he can remember,  He denies significant neck pain, low back pain, denies bowel bladder incontinence, there was no family history of neuromuscular disease  On today's examination, he was noted to have mild bilateral upper extremity proximal and distal muscle weakness, mild to moderate bilateral toe flexion extension weakness, sensory changes, hyperreflexia  I do not think botulinum toxin injection is indicated in the setting of muscle weakness, discussed with patient, we decided to further evaluation for potential underlying neuromuscular disease,  REVIEW OF SYSTEMS: Full 14 system review of systems performed and notable only for as above All other review of systems were negative.  ALLERGIES: No Known Allergies  HOME MEDICATIONS: Current Outpatient Medications  Medication Sig  Dispense Refill  . ammonium lactate (AMLACTIN) 12 % lotion Apply 1 application topically as needed for dry skin. 400 g 0  . cyclobenzaprine (FLEXERIL) 10 MG tablet Take 1 tablet (10 mg total) by mouth 3 (three) times daily as needed for muscle spasms. 30 tablet 0  . meloxicam (MOBIC) 15 MG tablet Take 1 tablet (15 mg total) by mouth daily. 30 tablet 3  . promethazine (PHENERGAN) 25 MG tablet Take 25 mg by mouth every 6 (six) hours as needed.     No current facility-administered medications for this visit.    PAST MEDICAL HISTORY: Past Medical History:  Diagnosis Date  . Acid reflux   . Anxiety   . Flat foot     PAST SURGICAL HISTORY: Past Surgical History:  Procedure Laterality Date  . ANKLE SURGERY  2010    FAMILY HISTORY: Family History  Problem Relation Age of Onset  . Prostate cancer Paternal Grandfather   . Kidney Stones Other   . Kidney disease Neg Hx     SOCIAL HISTORY: Social History   Socioeconomic History  . Marital status: Single    Spouse name: Not on file  . Number of children: Not on file  . Years of education: Not on file  . Highest education level: Not on file  Occupational History  . Not on file  Tobacco Use  . Smoking status: Never Smoker  . Smokeless tobacco: Never Used  Substance and Sexual Activity  . Alcohol use: Yes    Alcohol/week: 0.0 standard drinks  . Drug use: Yes    Types: Marijuana  Comment: marijuana  . Sexual activity: Not on file    Comment: twice daily  Other Topics Concern  . Not on file  Social History Narrative  . Not on file   Social Determinants of Health   Financial Resource Strain:   . Difficulty of Paying Living Expenses:   Food Insecurity:   . Worried About Programme researcher, broadcasting/film/video in the Last Year:   . Barista in the Last Year:   Transportation Needs:   . Freight forwarder (Medical):   Marland Kitchen Lack of Transportation (Non-Medical):   Physical Activity:   . Days of Exercise per Week:   . Minutes of  Exercise per Session:   Stress:   . Feeling of Stress :   Social Connections:   . Frequency of Communication with Friends and Family:   . Frequency of Social Gatherings with Friends and Family:   . Attends Religious Services:   . Active Member of Clubs or Organizations:   . Attends Banker Meetings:   Marland Kitchen Marital Status:   Intimate Partner Violence:   . Fear of Current or Ex-Partner:   . Emotionally Abused:   Marland Kitchen Physically Abused:   . Sexually Abused:      PHYSICAL EXAM   Vitals:   10/13/19 1107  BP: (!) 142/87  Pulse: 87  Temp: (!) 97.2 F (36.2 C)  TempSrc: Temporal  Weight: 246 lb 5 oz (111.7 kg)  Height: 5\' 10"  (1.778 m)    Not recorded      Body mass index is 35.34 kg/m.  PHYSICAL EXAMNIATION:  Gen: NAD, conversant, well nourised, well groomed                     Cardiovascular: Regular rate rhythm, no peripheral edema, warm, nontender. Eyes: Conjunctivae clear without exudates or hemorrhage Neck: Supple, no carotid bruits. Pulmonary: Clear to auscultation bilaterally   NEUROLOGICAL EXAM:  MENTAL STATUS: Speech:    Speech is normal; fluent and spontaneous with normal comprehension.  Cognition:     Orientation to time, place and person     Normal recent and remote memory     Normal Attention span and concentration     Normal Language, naming, repeating,spontaneous speech     Fund of knowledge   CRANIAL NERVES: CN II: Visual fields are full to confrontation. Pupils are round equal and briskly reactive to light. CN III, IV, VI: extraocular movement are normal. No ptosis. CN V: Facial sensation is intact to light touch CN VII: Face is symmetric with normal eye closure  CN VIII: Hearing is normal to causal conversation. CN IX, X: Phonation is normal. CN XI: Head turning and shoulder shrug are intact  MOTOR: Very muscular looking, but surprisingly, he has mild bilateral upper extremity proximal muscle weakness, mild weakness of bilateral  shoulder abduction, external rotation, mild bilateral hand grip weakness, mild bilateral finger flexion, especially digital PIP weakness  He also has bilateral flatfeet, right lateral heel dry scaly scar, no proximal lower extremity muscle weakness, mild to moderate bilateral toe flexion, extension weakness,  REFLEXES: Reflexes are 3 and symmetric at the biceps, triceps, knees, and ankles. Plantar responses are flexor bilaterally  SENSORY: Mildly decreased vibratory sensation at fingertips and toes, slight decreased toe proprioception, mildly length dependent decreased pinprick to mid foot  COORDINATION: There is no trunk or limb dysmetria noted.  GAIT/STANCE: He can get up from seated position arms crossed, flatfeet, pointing toes outwards, difficulty clear his  foot from floor, could not stand up on tiptoe, heels.   DIAGNOSTIC DATA (LABS, IMAGING, TESTING) - I reviewed patient records, labs, notes, testing and imaging myself where available.   ASSESSMENT AND PLAN  Arthur [name] is a 29 y.o. male   Long history of bilateral flatfeet, mild difficulty walking, worsening right ankle, foot pain,  On examination, he has mild to moderate bilateral total extension, flexion weakness, mildly length dependent sensory changes, hyperreflexia of bilateral upper and lower extremities, also has mild bilateral upper extremity weakness, especially distal upper extremity weakness  Potential localized the problem to cervical spinal cord, or even bilateral hemisphere, also need to rule out peripheral etiology  MRI of brain, cervical cord,  Laboratory evaluations for treatable etiology  EMG nerve conduction study  I do not think botulinum toxin injection is indicated in the setting of muscle weakness, discussed with patient, we decided to further evaluation for potential underlying neuromuscular disease,  Levert Feinstein, M.D. Ph.D.  [facility] [address], [address] [place], [place]  [address] Ph: [phone] Fax: [phone]  CC: Elinor Parkinson, [place]

## 2019-10-17 ENCOUNTER — Telehealth: Payer: Self-pay | Admitting: Neurology

## 2019-10-17 ENCOUNTER — Telehealth: Payer: Self-pay

## 2019-10-17 LAB — COMPREHENSIVE METABOLIC PANEL
ALT: 46 IU/L — ABNORMAL HIGH (ref 0–44)
AST: 22 IU/L (ref 0–40)
Albumin/Globulin Ratio: 1.8 (ref 1.2–2.2)
Albumin: 4.4 g/dL (ref 4.1–5.2)
Alkaline Phosphatase: 110 IU/L (ref 39–117)
BUN/Creatinine Ratio: 14 (ref 9–20)
BUN: 12 mg/dL (ref 6–20)
Bilirubin Total: 0.4 mg/dL (ref 0.0–1.2)
CO2: 25 mmol/L (ref 20–29)
Calcium: 9.6 mg/dL (ref 8.7–10.2)
Chloride: 106 mmol/L (ref 96–106)
Creatinine, Ser: 0.85 mg/dL (ref 0.76–1.27)
GFR calc Af Amer: 138 mL/min/{1.73_m2} (ref >59)
GFR calc non Af Amer: 119 mL/min/{1.73_m2} (ref >59)
Globulin, Total: 2.5 g/dL (ref 1.5–4.5)
Glucose: 78 mg/dL (ref 65–99)
Potassium: 4.4 mmol/L (ref 3.5–5.2)
Sodium: 144 mmol/L (ref 134–144)
Total Protein: 6.9 g/dL (ref 6.0–8.5)

## 2019-10-17 LAB — CBC WITH DIFFERENTIAL
Basophils Absolute: 0 10*3/uL (ref 0.0–0.2)
Basos: 0 %
EOS (ABSOLUTE): 0.2 10*3/uL (ref 0.0–0.4)
Eos: 3 %
Hematocrit: 45.4 % (ref 37.5–51.0)
Hemoglobin: 15.3 g/dL (ref 13.0–17.7)
Immature Grans (Abs): 0 10*3/uL (ref 0.0–0.1)
Immature Granulocytes: 0 %
Lymphocytes Absolute: 2.7 10*3/uL (ref 0.7–3.1)
Lymphs: 37 %
MCH: 29.9 pg (ref 26.6–33.0)
MCHC: 33.7 g/dL (ref 31.5–35.7)
MCV: 89 fL (ref 79–97)
Monocytes Absolute: 0.5 10*3/uL (ref 0.1–0.9)
Monocytes: 7 %
Neutrophils Absolute: 3.8 10*3/uL (ref 1.4–7.0)
Neutrophils: 53 %
RBC: 5.12 x10E6/uL (ref 4.14–5.80)
RDW: 11.7 % (ref 11.6–15.4)
WBC: 7.4 10*3/uL (ref 3.4–10.8)

## 2019-10-17 LAB — COPPER, SERUM: Copper: 95 ug/dL (ref 63–121)

## 2019-10-17 LAB — CK: Total CK: 171 U/L (ref 49–439)

## 2019-10-17 LAB — RPR: RPR Ser Ql: NONREACTIVE

## 2019-10-17 LAB — HIV ANTIBODY (ROUTINE TESTING W REFLEX): HIV Screen 4th Generation wRfx: NONREACTIVE

## 2019-10-17 LAB — SEDIMENTATION RATE: Sed Rate: 2 mm/hr (ref 0–15)

## 2019-10-17 LAB — ANA W/REFLEX IF POSITIVE: Anti Nuclear Antibody (ANA): NEGATIVE

## 2019-10-17 LAB — VITAMIN B12: Vitamin B-12: 317 pg/mL (ref 232–1245)

## 2019-10-17 LAB — FOLATE: Folate: 12.1 ng/mL (ref >3.0)

## 2019-10-17 LAB — C-REACTIVE PROTEIN: CRP: 1 mg/L (ref 0–10)

## 2019-10-17 LAB — HGB A1C W/O EAG: Hgb A1c MFr Bld: 5.4 % (ref 4.8–5.6)

## 2019-10-17 LAB — TSH: TSH: 1.42 u[IU]/mL (ref 0.450–4.500)

## 2019-10-17 NOTE — Telephone Encounter (Signed)
See telephone note from 10/17/19.

## 2019-10-17 NOTE — Telephone Encounter (Signed)
Pt has called to report he would like a call just as soon as his blood work results are available

## 2019-10-17 NOTE — Telephone Encounter (Signed)
-----   Message from Levert Feinstein, MD sent at 10/17/2019  2:40 PM EDT ----- Please call patient for laboratory evaluation showed no significant abnormalities.

## 2019-10-17 NOTE — Telephone Encounter (Signed)
Please call patient, extensive laboratory evaluation showed no significant abnormalities.

## 2019-10-17 NOTE — Telephone Encounter (Signed)
I called pt. I advised him of his lab results. Pt verbalized understanding of results. Pt had no questions at this time but was encouraged to call back if questions arise.  

## 2019-10-24 ENCOUNTER — Telehealth: Payer: Self-pay | Admitting: Neurology

## 2019-10-24 NOTE — Telephone Encounter (Signed)
no to the covid questions MR Brain wo contrast & MR Cervical spine wo contrast Dr. Binnie Kand Auth: NPR Ref # [id number]. Patient is scheduled at [facility] for 11/08/19.

## 2019-11-08 ENCOUNTER — Other Ambulatory Visit: Payer: Self-pay

## 2019-11-08 ENCOUNTER — Ambulatory Visit: Payer: Commercial Managed Care - PPO

## 2019-11-08 DIAGNOSIS — R292 Abnormal reflex: Secondary | ICD-10-CM | POA: Diagnosis not present

## 2019-11-08 DIAGNOSIS — R269 Unspecified abnormalities of gait and mobility: Secondary | ICD-10-CM | POA: Diagnosis not present

## 2019-11-08 DIAGNOSIS — R531 Weakness: Secondary | ICD-10-CM

## 2019-11-09 ENCOUNTER — Other Ambulatory Visit: Payer: Self-pay

## 2019-11-09 ENCOUNTER — Ambulatory Visit (INDEPENDENT_AMBULATORY_CARE_PROVIDER_SITE_OTHER): Payer: Commercial Managed Care - PPO | Admitting: Neurology

## 2019-11-09 ENCOUNTER — Ambulatory Visit: Payer: Commercial Managed Care - PPO | Admitting: Neurology

## 2019-11-09 ENCOUNTER — Telehealth: Payer: Self-pay | Admitting: *Deleted

## 2019-11-09 DIAGNOSIS — G4719 Other hypersomnia: Secondary | ICD-10-CM | POA: Insufficient documentation

## 2019-11-09 DIAGNOSIS — R531 Weakness: Secondary | ICD-10-CM

## 2019-11-09 DIAGNOSIS — R292 Abnormal reflex: Secondary | ICD-10-CM | POA: Diagnosis not present

## 2019-11-09 DIAGNOSIS — R269 Unspecified abnormalities of gait and mobility: Secondary | ICD-10-CM

## 2019-11-09 DIAGNOSIS — Z0289 Encounter for other administrative examinations: Secondary | ICD-10-CM

## 2019-11-09 MED ORDER — DULOXETINE HCL 60 MG PO CPEP: 60.0000 mg | ORAL_CAPSULE | Freq: Every day | ORAL | 12 refills | Status: DC
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
## 2019-11-09 NOTE — Progress Notes (Signed)
PATIENT: Arthur [name] DOB: 01/12/1991  No chief complaint on file.    HISTORICAL  Arthur [name] is a 29 year old male, seen in request by podiatrist Dr. Al Corpus, Max T for evaluation of bilateral foot pain, history of right foot surgery, potential botulism toxin injection for spasticity and pain of foot, initial evaluation was on Oct 13, 2019.  I have reviewed and summarized the referring note from the referring physician.  He was born full-term, developmentally normal, was noted to have flatfeet since childhood, by age 39, he began to complains of pain when bearing weight, eventually underwent to right Achilles tendon and right foot surgery, which failed to help his symptoms, actually he experienced more right foot pain when bearing weight, especially morning times, it is hard for him to put weight on his foot, he has to gradually with few tries to adapt to his walking, he denies noticeable sensory loss, but did complains of difficulty making a tight grip as long as he can remember,  He denies significant neck pain, low back pain, denies bowel bladder incontinence, there was no family history of neuromuscular disease  On today's examination, he was noted to have mild bilateral upper extremity proximal and distal muscle weakness, mild to moderate bilateral toe flexion extension weakness, sensory changes, hyperreflexia  I do not think botulinum toxin injection is indicated in the setting of muscle weakness, discussed with patient, we decided to further evaluation for potential underlying neuromuscular disease,  REVIEW OF SYSTEMS: Full 14 system review of systems performed and notable only for as above All other review of systems were negative.  ALLERGIES: No Known Allergies  HOME MEDICATIONS: Current Outpatient Medications  Medication Sig Dispense Refill  . ammonium lactate (AMLACTIN) 12 % lotion Apply 1 application topically as needed for dry skin. 400 g 0  . cyclobenzaprine (FLEXERIL)  10 MG tablet Take 1 tablet (10 mg total) by mouth 3 (three) times daily as needed for muscle spasms. 30 tablet 0  . meloxicam (MOBIC) 15 MG tablet Take 1 tablet (15 mg total) by mouth daily. 30 tablet 3  . promethazine (PHENERGAN) 25 MG tablet Take 25 mg by mouth every 6 (six) hours as needed.     No current facility-administered medications for this visit.    PAST MEDICAL HISTORY: Past Medical History:  Diagnosis Date  . Acid reflux   . Anxiety   . Flat foot     PAST SURGICAL HISTORY: Past Surgical History:  Procedure Laterality Date  . ANKLE SURGERY  2010    FAMILY HISTORY: Family History  Problem Relation Age of Onset  . Prostate cancer Paternal Grandfather   . Kidney Stones Other   . Kidney disease Neg Hx     SOCIAL HISTORY: Social History   Socioeconomic History  . Marital status: Single    Spouse name: Not on file  . Number of children: Not on file  . Years of education: Not on file  . Highest education level: Not on file  Occupational History  . Not on file  Tobacco Use  . Smoking status: Never Smoker  . Smokeless tobacco: Never Used  Substance and Sexual Activity  . Alcohol use: Yes    Alcohol/week: 0.0 standard drinks  . Drug use: Yes    Types: Marijuana    Comment: marijuana  . Sexual activity: Not on file    Comment: twice daily  Other Topics Concern  . Not on file  Social History Narrative  . Not on  file   Social Determinants of Health   Financial Resource Strain:   . Difficulty of Paying Living Expenses:   Food Insecurity:   . Worried About Programme researcher, broadcasting/film/video in the Last Year:   . Barista in the Last Year:   Transportation Needs:   . Freight forwarder (Medical):   Marland Kitchen Lack of Transportation (Non-Medical):   Physical Activity:   . Days of Exercise per Week:   . Minutes of Exercise per Session:   Stress:   . Feeling of Stress :   Social Connections:   . Frequency of Communication with Friends and Family:   . Frequency of  Social Gatherings with Friends and Family:   . Attends Religious Services:   . Active Member of Clubs or Organizations:   . Attends Banker Meetings:   Marland Kitchen Marital Status:   Intimate Partner Violence:   . Fear of Current or Ex-Partner:   . Emotionally Abused:   Marland Kitchen Physically Abused:   . Sexually Abused:      PHYSICAL EXAM   There were no vitals filed for this visit.  Not recorded      There is no height or weight on file to calculate BMI.  PHYSICAL EXAMNIATION:  Gen: NAD, conversant, well nourised, well groomed                     Cardiovascular: Regular rate rhythm, no peripheral edema, warm, nontender. Eyes: Conjunctivae clear without exudates or hemorrhage Neck: Supple, no carotid bruits. Pulmonary: Clear to auscultation bilaterally   NEUROLOGICAL EXAM:  MENTAL STATUS: Speech:    Speech is normal; fluent and spontaneous with normal comprehension.  Cognition:     Orientation to time, place and person     Normal recent and remote memory     Normal Attention span and concentration     Normal Language, naming, repeating,spontaneous speech     Fund of knowledge   CRANIAL NERVES: CN II: Visual fields are full to confrontation. Pupils are round equal and briskly reactive to light. CN III, IV, VI: extraocular movement are normal. No ptosis. CN V: Facial sensation is intact to light touch CN VII: Face is symmetric with normal eye closure  CN VIII: Hearing is normal to causal conversation. CN IX, X: Phonation is normal. CN XI: Head turning and shoulder shrug are intact  MOTOR: Very muscular looking, but surprisingly, he has mild bilateral upper extremity proximal muscle weakness, mild weakness of bilateral shoulder abduction, external rotation, mild bilateral hand grip weakness, mild bilateral finger flexion, especially digital PIP weakness  He also has bilateral flatfeet, right lateral heel dry scaly scar, no proximal lower extremity muscle weakness, mild to  moderate bilateral toe flexion, extension weakness,  REFLEXES: Reflexes are 3 and symmetric at the biceps, triceps, knees, and ankles. Plantar responses are flexor bilaterally  SENSORY: Mildly decreased vibratory sensation at fingertips and toes, slight decreased toe proprioception, mildly length dependent decreased pinprick to mid foot  COORDINATION: There is no trunk or limb dysmetria noted.  GAIT/STANCE: He can get up from seated position arms crossed, flatfeet, pointing toes outwards, difficulty clear his foot from floor, could not stand up on tiptoe, heels.   DIAGNOSTIC DATA (LABS, IMAGING, TESTING) - I reviewed patient records, labs, notes, testing and imaging myself where available.   ASSESSMENT AND PLAN  Arthur [name] is a 29 y.o. male   Long history of bilateral flatfeet, mild difficulty walking, worsening right ankle,  foot pain,  On examination, he has mild to moderate bilateral total extension, flexion weakness, mildly length dependent sensory changes, hyperreflexia of bilateral upper and lower extremities, also has mild bilateral upper extremity weakness, especially distal upper extremity weakness  Potential localized the problem to cervical spinal cord, or even bilateral hemisphere, also need to rule out peripheral etiology  MRI of brain, cervical cord,  Laboratory evaluations for treatable etiology  EMG nerve conduction study  I do not think botulinum toxin injection is indicated in the setting of muscle weakness, discussed with patient, we decided to further evaluation for potential underlying neuromuscular disease,  Marcial Pacas, M.D. Ph.D.  [facility] [address] [place], [place] [address] Ph: [phone] Fax: [phone]  CC: Garrel Ridgel, [place]

## 2019-11-09 NOTE — Telephone Encounter (Signed)
-----   Message from Levert Feinstein, MD sent at 11/09/2019  5:09 PM EDT ----- Please call pt for normal MRI brain and cervical.

## 2019-11-09 NOTE — Telephone Encounter (Signed)
I have spoken to the patient. He verbalized understanding of his lab results. He would like to pick up a printed copy of the results along with the disc. His request will be sent to medical records. He is aware to expect a call when it is ready for pick up.

## 2019-11-09 NOTE — Procedures (Signed)
Full Name: Arthur [name] Gender: Male MRN #: [id number] Date of Birth: Jun 14, 1990    Visit Date: 11/09/2019 08:15 Age: 29 Years Examining Physician: Marcial Pacas, MD  Referring Physician: Marcial Pacas, MD Height: 5 feet 10 inch History:   29 years old male presented with bilateral feet pain, flat feet, gait abnormalities.  Summary of the tests: Nerve conduction study: Left sural, superficial peroneal, median, ulnar sensory responses were normal. Left median and ulnar mixed responses were normal.  Left median, ulnar, peroneal to EDB motor responses were normal.  Left tibial motor responses showed moderately decreased CMAP amplitude, with normal distal latency, conduction velocity, and F-wave latency.  Electromyography: Selected needle examination of left upper and lower extremity muscles, left lumbosacral paraspinal muscles were normal.  Conclusion: This is a slight abnormal study.  There is no electrodiagnostic evidence of large fiber peripheral neuropathy, left lumbosacral or left cervical radiculopathy.  The mildly decreased CMAP of left tibial motor responses can be due to his flatfeet, underdeveloped abductor hallucis muscle.     ------------------------------- Marcial Pacas, M.D.Ph.D.  [facility] [address] [place], [place] [address] Tel: [phone] Fax: [phone]  Verbal informed consent was obtained from the patient, patient was informed of potential risk of procedure, including bruising, bleeding, hematoma formation, infection, muscle weakness, muscle pain, numbness, among others.        [place]    Nerve / Sites Muscle Latency Ref. Amplitude Ref. Rel Amp Segments Distance Velocity Ref. Area    ms ms mV mV %  cm m/s m/s mVms  L Median - APB     Wrist APB 3.7 ?4.4 11.2 ?4.0 100 Wrist - APB 7   39.2     Upper arm APB 7.8  11.2  100 Upper arm - Wrist 23 57 ?49 40.6  L Ulnar - ADM     Wrist ADM 2.9 ?3.3 11.7 ?6.0 100 Wrist - ADM 7   36.1   B.Elbow ADM 6.3  11.1  95.4 B.Elbow - Wrist 21 61 ?49 36.0     A.Elbow ADM 8.0  10.9  98.1 A.Elbow - B.Elbow 10 59 ?49 34.4  L Peroneal - EDB     Ankle EDB 3.8 ?6.5 9.3 ?2.0 100 Ankle - EDB 9   25.9     Fib head EDB 9.5  6.4  68.4 Fib head - Ankle 29 51 ?44 23.1     Pop fossa EDB 11.5  5.3  83.5 Pop fossa - Fib head 10 51 ?44 20.6         Pop fossa - Ankle      L Tibial - AH     Ankle AH 3.8 ?5.8 2.2 ?4.0 100 Ankle - AH 9   14.4     Pop fossa AH 12.7  1.4  62.5 Pop fossa - Ankle 41 46 ?41 7.1             SNC    Nerve / Sites Rec. Site Peak Lat Ref.  Amp Ref. Segments Distance Peak Diff Ref.    ms ms V V  cm ms ms  L Sural - Ankle (Calf)     Calf Ankle 3.3 ?4.4 12 ?6 Calf - Ankle 14    L Superficial peroneal - Ankle     Lat leg Ankle 3.6 ?4.4 13 ?6 Lat leg - Ankle 14    L Median, Ulnar - Transcarpal comparison     Median Palm Wrist 2.2 ?  2.2 76 ?35 Median Palm - Wrist 8       Ulnar Palm Wrist 2.0 ?2.2 16 ?12 Ulnar Palm - Wrist 8          Median Palm - Ulnar Palm  0.2 ?0.4  L Median - Orthodromic (Dig II, Mid palm)     Dig II Wrist 3.3 ?3.4 13 ?10 Dig II - Wrist 13    L Ulnar - Orthodromic, (Dig V, Mid palm)     Dig V Wrist 2.9 ?3.1 5 ?5 Dig V - Wrist 68                 F  Wave    Nerve F Lat Ref.   ms ms  L Tibial - AH 54.8 ?56.0  L Ulnar - ADM 28.1 ?32.0         EMG Summary Table    Spontaneous MUAP Recruitment  Muscle IA Fib PSW Fasc Other Amp Dur. Poly Pattern  L. Tibialis anterior Normal None None None _______ Normal Normal Normal Normal  L. Tibialis posterior Normal None None None _______ Normal Normal Normal Normal  L. Peroneus longus Normal None None None _______ Normal Normal Normal Normal  L. Gastrocnemius (Medial head) Normal None None None _______ Normal Normal Normal Normal  L. Lumbar paraspinals Normal None None None _______ Normal Normal Normal Normal  L. Vastus lateralis Normal None None None _______ Normal Normal Normal Normal  L. Abductor hallucis Normal  None None None _______ Normal Normal Normal Normal  L. First dorsal interosseous Normal None None None _______ Normal Normal Normal Normal  L. Pronator teres Normal None None None _______ Normal Normal Normal Normal  L. Biceps brachii Normal None None None _______ Normal Normal Normal Normal  L. Deltoid Normal None None None _______ Normal Normal Normal Normal  L. Triceps brachii Normal None None None _______ Normal Normal Normal Normal

## 2019-11-10 ENCOUNTER — Telehealth: Payer: Self-pay | Admitting: *Deleted

## 2019-11-10 NOTE — Telephone Encounter (Signed)
Pt report and cd @ the front desk for p/u. 

## 2019-12-01 ENCOUNTER — Ambulatory Visit (INDEPENDENT_AMBULATORY_CARE_PROVIDER_SITE_OTHER): Payer: Commercial Managed Care - PPO | Admitting: Neurology

## 2019-12-01 ENCOUNTER — Encounter: Payer: Self-pay | Admitting: Neurology

## 2019-12-01 VITALS — BP 126/76 | HR 84 | Ht 70.0 in | Wt 248.0 lb

## 2019-12-01 DIAGNOSIS — R400 Somnolence: Secondary | ICD-10-CM

## 2019-12-01 DIAGNOSIS — G4710 Hypersomnia, unspecified: Secondary | ICD-10-CM

## 2019-12-01 DIAGNOSIS — E669 Obesity, unspecified: Secondary | ICD-10-CM

## 2019-12-01 DIAGNOSIS — Z79899 Other long term (current) drug therapy: Secondary | ICD-10-CM

## 2019-12-01 NOTE — Patient Instructions (Signed)
I believe you may have an underlying sleepiness condition. This means, that you have a sleep disorder that manifests with excessive sleep and excessive sleepiness during the day.  We will do additional testing from my end of things: I would like for you to come back for an overnight sleep study during which you will be monitored with a lot of wires and sensors.  We will monitor your breathing pattern and oxygen level and also look for signs for sleep apnea.  We will then do nap study testing the next day: 5 scheduled 20 min nap opportunities, every 2 hours. We will remind you to stay awake in between naps.   As explained, you will have to be off of any excess caffeine or stimulant or antidepressant medication in preparation for the sleep studies.  Please refrain from smoking marijuana.  It may skew the results if you smoke marijuana as it can cause sedation or sleepiness or drowsiness.  Please also refrain from taking anyone else's medication such as Provigil.
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
## 2019-12-06 ENCOUNTER — Other Ambulatory Visit: Payer: Commercial Managed Care - PPO

## 2020-01-03 ENCOUNTER — Ambulatory Visit: Payer: Commercial Managed Care - PPO | Admitting: Neurology

## 2020-01-03 ENCOUNTER — Other Ambulatory Visit: Payer: Self-pay

## 2020-01-03 DIAGNOSIS — R269 Unspecified abnormalities of gait and mobility: Secondary | ICD-10-CM

## 2020-01-03 DIAGNOSIS — G4719 Other hypersomnia: Secondary | ICD-10-CM

## 2020-01-03 DIAGNOSIS — R531 Weakness: Secondary | ICD-10-CM

## 2020-01-12 NOTE — Procedures (Signed)
   HISTORY:  TECHNIQUE:  This is a routine 16 channel EEG recording with one channel devoted to a limited EKG recording.  It was performed during wakefulness, drowsiness and asleep.  Hyperventilation and photic stimulation were performed as activating procedures.  There are minimum muscle and movement artifact noted.  Upon maximum arousal, posterior dominant waking rhythm consistent of rhythmic alpha range activity, with frequency of 11 hz. Activities are symmetric over the bilateral posterior derivations and attenuated with eye opening.  Hyperventilation produced mild/moderate buildup with higher amplitude and the slower activities noted.  Photic stimulation did not alter the tracing.  During EEG recording, patient developed drowsiness and entered sleep, sleep EEG demonstrated architecture, there were frontal centrally dominant vertex waves and symmetric sleep spindles noted.  During EEG recording, there was no epileptiform discharge noted.  EKG demonstrate sinus rhythm, with heart rate of 76 bpm  CONCLUSION: This is a  normal awake and sleep EEG.  There is no electrodiagnostic evidence of epileptiform discharge.  Levert Feinstein, M.D. Ph.D.  [facility] [address] [place], [place] [address] Phone: [phone] Fax:      [phone]

## 2020-01-16 ENCOUNTER — Ambulatory Visit (INDEPENDENT_AMBULATORY_CARE_PROVIDER_SITE_OTHER): Payer: Commercial Managed Care - PPO | Admitting: Neurology

## 2020-01-16 DIAGNOSIS — R400 Somnolence: Secondary | ICD-10-CM

## 2020-01-16 DIAGNOSIS — G47419 Narcolepsy without cataplexy: Secondary | ICD-10-CM

## 2020-01-16 DIAGNOSIS — E669 Obesity, unspecified: Secondary | ICD-10-CM

## 2020-01-16 DIAGNOSIS — G4710 Hypersomnia, unspecified: Secondary | ICD-10-CM

## 2020-01-17 ENCOUNTER — Ambulatory Visit (INDEPENDENT_AMBULATORY_CARE_PROVIDER_SITE_OTHER): Payer: Commercial Managed Care - PPO | Admitting: Neurology

## 2020-01-17 ENCOUNTER — Other Ambulatory Visit: Payer: Self-pay

## 2020-01-17 DIAGNOSIS — G4710 Hypersomnia, unspecified: Secondary | ICD-10-CM

## 2020-01-17 DIAGNOSIS — G47419 Narcolepsy without cataplexy: Secondary | ICD-10-CM

## 2020-01-17 DIAGNOSIS — E669 Obesity, unspecified: Secondary | ICD-10-CM

## 2020-01-17 DIAGNOSIS — R400 Somnolence: Secondary | ICD-10-CM

## 2020-01-17 DIAGNOSIS — Z79899 Other long term (current) drug therapy: Secondary | ICD-10-CM

## 2020-01-17 NOTE — Addendum Note (Signed)
Addended by: Tamera Stands D on: 01/17/2020 01:46 PM   Modules accepted: Orders

## 2020-01-19 LAB — COMPREHENSIVE DRUG ANALYSIS,UR

## 2020-01-31 ENCOUNTER — Telehealth: Payer: Self-pay | Admitting: Neurology

## 2020-01-31 NOTE — Telephone Encounter (Signed)
Pt has called inquiring about his result from NPSG/MSLT. Please advise.

## 2020-01-31 NOTE — Telephone Encounter (Signed)
I do apologize for the delay.  I am trying to catch up on my back a log, I will work on it first thing in the morning.

## 2020-02-01 ENCOUNTER — Telehealth: Payer: Self-pay

## 2020-02-01 NOTE — Telephone Encounter (Signed)
-----   Message from Huston Foley, MD sent at 02/01/2020  9:26 AM EDT ----- Patient referred by Dr. Terrace Arabia, seen by me on 12/01/19, PSG on 01/16/20, MSLT on 01/17/20:  Please call and inform patient that the recent sleep studies support the diagnosis of narcolepsy and that I would recommend treatment for this. I would like to go over details of the studies and treatment options during an appointment. If not already arranged, please arrange for an appointment next available with me. We will talk about the test results and discuss treatment options at the time. Thanks, Huston Foley, MD, PhD [facility])

## 2020-02-01 NOTE — Procedures (Signed)
HISTORY: 29 year old man with a history of gait disorder, foot pain, anxiety, reflux and obesity, who reports a longstanding history of daytime somnolence, at times uncontrollable sleepiness which overcomes him suddenly. The patient endorsed the Epworth Sleepiness Scale at 23 points. He presents for a baseline nocturnal sleep study, followed by a next day nap study. The patient's weight 247 pounds with a height of 70 (inches), resulting in a BMI of 35.3 kg/m2. The patient's neck circumference measured 15 inches. A UDS on 01/17/20 was negative, with the exception of presence of acetaminophen. Of note, the patient was given acetaminophen on the day of his nap study as he complained of a headache after his first nap. He improved after taking Tylenol and by nap #3 his headache was completely alleviated.  Protocol  This is a 13 channel Multiple Sleep Latency Test comprised of 5 channels of EEG (T3-Cz, Cz-T4, F4-M1, C4-M1, O2-M1), 3 channels of Chin EMG, 4 channels of EOG and 1 channel for ECG.   All channels were sampled at 256hz .    This polysomnographic procedure is designed to evaluate (1) the complaint of excessive daytime sleepiness by quantifying the time required to fall asleep and (2) the possibility of narcolepsy by checking for abnormally short latencies to REM sleep.  Electrographic variables include EEG, EMG, EOG and ECG.  Patients are monitored throughout four or five 20-minute opportunities to sleep (naps) at two-hour intervals.  For each nap, the patient is allowed 20 minutes to fall asleep.  Once asleep, the patient is awakened after 15 minutes.  Between naps, the patient is kept as alert as possible.  A sleep latency of 20 minutes indicates that no sleep occurred.  Parametric Analysis  Total Number of Naps 5     NAP # Time of Nap  Sleep Latency (mins) REM Latency (mins) Sleep Time Percent Awake Time Percent  1 07:46 3.5 4.5 82 18   2 09:46 2 0 88  12   3 11:41 4 10.5 64  36   4  13:43 11.5 0 41  59   5 15:46 2 0 57  43    MSLT Summary of Naps  Sleepiness Index: 77  Mean Sleep Latency to all Five Naps: 4.6  Mean Sleep Latency to First Four Naps: 5.25  Mean Sleep Latency to First Three Naps: 3.2  Mean Sleep Latency to First Two Naps: 2.8  Number of Naps with REM Sleep: 2    Results from Preceding PSG Study  Sleep Onset Time 20:54 Sleep Efficiency (%) 93.1 %  Rise Time 05:44 Sleep Latency (min) 3.5 min  Total Sleep Time  8.3 H REM Latency (min) 78 min    I attest to having reviewed every epoch of the entire raw data recording prior to the issuance of this report in accordance with the Standards of the American Academy of Sleep Medicine.    INTERPRETATION:   This multiple sleep latency test reveals a mean sleep latency of 4.6 minutes with 2 sleep periods during which REM sleep was recorded.  A total of 5 sleep periods were recorded. This study was preceded by an overnight polysomnogram with a total sleep time (TST) of 497 minutes. This MSLT shows a reduced sleep latency and presence of 2 REM onset naps. This, in conjunction with his clinical history support the diagnosis of narcolepsy.   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 The overnight sleep study preceding the nap study did not demonstrate any significant  obstructive or central sleep disordered breathing with the exception of mild intermittent snoring.  2. The overnight sleep study and next day MSLT/nap study demonstrate a high normal percentage of REM sleep during the overnight study with a normal REM latency, and a reduced mean sleep latency of 4.6 minutes, and 2 SOREMPs (sleep onset REM periods) for the nap study. 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 Treatment options for narcolepsy should be discussed with the patient. The patient should be cautioned  not to drive, work at heights, or operate dangerous or heavy equipment when tired or sleepy. Review and reiteration of good sleep hygiene measures should be pursued with any patient. 3. The patient will be seen in follow-up by Dr. Frances Furbish at [facility] for discussion of the test results and further management strategies. The referring provider will be notified of the test results.   I certify that I have reviewed the entire raw data recording prior to the issuance of this report in accordance with the Standards of Accreditation of the American Academy of Sleep Medicine (AASM)   Huston Foley, MD, PhD Diplomat, American Board of Neurology and Sleep Medicine (Neurology and Sleep Medicine)

## 2020-02-01 NOTE — Progress Notes (Signed)
Patient referred by Dr. Terrace Arabia, seen by me on 12/01/19, PSG on 01/16/20, MSLT on 01/17/20:  Please call and inform patient that the recent sleep studies support the diagnosis of narcolepsy and that I would recommend treatment for this. I would like to go over details of the studies and treatment options during an appointment. If not already arranged, please arrange for an appointment next available with me. We will talk about the test results and discuss treatment options at the time. Thanks, Huston Foley, MD, PhD [facility])

## 2020-02-01 NOTE — Progress Notes (Signed)
See PSG result note.  

## 2020-02-01 NOTE — Telephone Encounter (Signed)
Pt scheduled for 1 pm on 02/02/2020 with Dr. Frances Furbish.

## 2020-02-01 NOTE — Procedures (Signed)
PATIENT'S NAME:  Arthur [name], Arthur [name] DOB:      1990/07/24      MR#:    [id number]     DATE OF RECORDING: 01/16/2020 REFERRING M.D.:  Dr. Terrace Arabia Study Performed:   Baseline Polysomnogram HISTORY: 29 year old man with a history of gait disorder, foot pain, anxiety, reflux and obesity, who reports a longstanding history of daytime somnolence, at times uncontrollable sleepiness which overcomes him suddenly. The patient endorsed the Epworth Sleepiness Scale at 23 points. He presents for a baseline nocturnal sleep study, followed by a next day nap study. The patient's weight 247 pounds with a height of 70 (inches), resulting in a BMI of 35.3 kg/m2. The patient's neck circumference measured 15 inches. A UDS on 01/17/20 was negative, with the exception of presence of acetaminophen. Of note, the patient was given acetaminophen on the day of his nap study as he complained of a headache after his first nap. He improved after taking Tylenol and by nap #3 his headache was completely alleviated.  CURRENT MEDICATIONS: Amlactin, Flexeril, Mobic, Phenergan   PROCEDURE:  This is a multichannel digital polysomnogram utilizing the Somnostar 11.2 system.  Electrodes and sensors were applied and monitored per AASM Specifications.   EEG, EOG, Chin and Limb EMG, were sampled at 200 Hz.  ECG, Snore and Nasal Pressure, Thermal Airflow, Respiratory Effort, CPAP Flow and Pressure, Oximetry was sampled at 50 Hz. Digital video and audio were recorded.      BASELINE STUDY  Lights Out was at 20:51 and Lights On at 05:44.  Total recording time (TRT) was 534 minutes, with a total sleep time (TST) of 497 minutes.   The patient's sleep latency was 3.5 minutes.  REM latency was 78 minutes, which is normal. The sleep efficiency was 93.1 %.     SLEEP ARCHITECTURE: WASO (Wake after sleep onset) was 33 minutes with mild to moderate sleep fragmentation noted. There were 28 minutes in Stage N1, 119 minutes Stage N2, 222.5 minutes Stage N3 and 127.5 minutes  in Stage REM.  The percentage of Stage N1 was 5.6%, Stage N2 was 23.9%, Stage N3 was 44.8%, which is increased, and Stage R (REM sleep) was 25.7%, which is high normal. The arousals were noted as: 58 were spontaneous, 6 were associated with PLMs, 2 were associated with respiratory events.  RESPIRATORY ANALYSIS:  There were a total of 19 respiratory events:  0 obstructive apneas, 0 central apneas and 0 mixed apneas with a total of 0 apneas and an apnea index (AI) of 0 /hour. There were 19 hypopneas with a hypopnea index of 2.3 /hour. The patient also had 0 respiratory event related arousals (RERAs).      The total APNEA/HYPOPNEA INDEX (AHI) was 2.3/hour and the total RESPIRATORY DISTURBANCE INDEX was  2.3 /hour.  5 events occurred in REM sleep and 28 events in NREM. The REM AHI was  2.4 /hour, versus a non-REM AHI of 2.3. The patient spent 187.5 minutes of total sleep time in the supine position and 310 minutes in non-supine.. The supine AHI was 5.1 versus a non-supine AHI of 0.6.  OXYGEN SATURATION & C02:  The Wake baseline 02 saturation was 97%, with the lowest being 82%. Time spent below 89% saturation equaled 5 minutes.    PERIODIC LIMB MOVEMENTS: The patient had a total of 15 Periodic Limb Movements.  The Periodic Limb Movement (PLM) index was 1.8 and the PLM Arousal index was .7/hour.  Audio and video analysis did not show any abnormal or unusual  movements, behaviors, phonations or vocalizations. The patient took 1 bathroom break. Intermittent mild snoring was noted. The EKG was in keeping with normal sinus rhythm (NSR).  Post-study, the patient indicated that sleep was the same as usual.   MSLT: The patient had a nap study, MSLT, next day on 01/17/20 with a mean sleep latency of 4.6 minutes for 5 naps and 2 SOREMPs (sleep onset REM periods) noted, namely during nap # 1 and 3.   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 The overnight sleep study  preceding the nap study did not demonstrate any significant obstructive or central sleep disordered breathing with the exception of mild intermittent snoring.  2. The overnight sleep study and next day MSLT/nap study demonstrate a high normal percentage of REM sleep during the overnight study with a normal REM latency, and a reduced mean sleep latency of 4.6 minutes, and 2 SOREMPs (sleep onset REM periods) for the nap study. 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 Treatment options for narcolepsy should be discussed with the patient. The patient should be cautioned not to drive, work at heights, or operate dangerous or heavy equipment when tired or sleepy. Review and reiteration of good sleep hygiene measures should be pursued with any patient. 3. The patient will be seen in follow-up by Dr. Frances Furbish at [facility] for discussion of the test results and further management strategies. The referring provider will be notified of the test results.   I certify that I have reviewed the entire raw data recording prior to the issuance of this report in accordance with the Standards of Accreditation of the American Academy of Sleep Medicine (AASM)   Huston Foley, MD, PhD Diplomat, American Board of Neurology and Sleep Medicine (Neurology and Sleep Medicine)

## 2020-02-01 NOTE — Telephone Encounter (Signed)
I called the pt and advised of results. He verbalized understanding. Right now Dr. Teofilo Pod first available is not until OCT. Pt was advised I would monitor the schedule to try and get him worked in sooner.

## 2020-02-02 ENCOUNTER — Ambulatory Visit (INDEPENDENT_AMBULATORY_CARE_PROVIDER_SITE_OTHER): Payer: Commercial Managed Care - PPO | Admitting: Neurology

## 2020-02-02 ENCOUNTER — Encounter: Payer: Self-pay | Admitting: Neurology

## 2020-02-02 ENCOUNTER — Other Ambulatory Visit: Payer: Self-pay

## 2020-02-02 VITALS — BP 120/64 | HR 84 | Ht 70.0 in | Wt 251.3 lb

## 2020-02-02 DIAGNOSIS — G47419 Narcolepsy without cataplexy: Secondary | ICD-10-CM | POA: Diagnosis not present

## 2020-02-02 MED ORDER — SUNOSI 75 MG PO TABS: 75.0000 mg | ORAL_TABLET | Freq: Every day | ORAL | 5 refills | Status: AC

## 2020-02-02 MED ORDER — SUNOSI 75 MG PO TABS: 75.0000 mg | ORAL_TABLET | Freq: Every day | ORAL | 0 refills | Status: AC

## 2020-02-02 NOTE — Patient Instructions (Addendum)
As discussed, we will try you on a wake promoting medication called Sunosi, which is FDA approved to treat sleepiness in patients with narcolepsy.   Start Sunosi: 75 mg strength, half a pill each morning for the first 6 das, then you can increase to 1 whole pill daily thereafter, samples are provided today.   Please let us know in the next 2 weeks if you benefit from the medication. I have prescribed for you as well. I will see you back in 3 months.   Common side effects of Sunosi include insomnia, nausea, headache, loss of appetite, anxiety and irritability. Side effects may also include increase in blood pressure and heart rate.

## 2020-02-02 NOTE — Progress Notes (Signed)
Subjective:    Patient ID: Arthur [name] is a 29 y.o. male.  HPI     Interim history:   Arthur [name] is a 29 year old right-handed gentleman with an underlying medical history of gait disorder, foot pain, anxiety, reflux and obesity, who Presents for follow-up consultation of his sleep disorder, in particular, his daytime somnolence, after recent interim testing.  The patient is unaccompanied today.  I first met him on 12/01/2019 at the request of Dr. Krista Blue, at which time he reported a longstanding history of significant daytime somnolence, at times uncontrollable sleepiness.  His Epworth sleepiness score was 23 out of 24.  He was advised to proceed with sleep study testing.  He also had an interim EEG on 01/03/2020 under Dr. Krista Blue, and it was reported as normal in the awake and sleep states.  He had a baseline sleep study, followed by a next day nap study.  I went over his test results with him in detail today. His baseline sleep study from 01/16/2020, showed a sleep efficiency of 93.1%, sleep latency was reduced at 3.5 minutes, REM latency normal at 78 minutes.  He had an increased percentage of slow-wave sleep, REM sleep was high normal at 25.7%.  He did not have any significant sleep disordered breathing and total AHI was 2.3/h.  O2 nadir was briefly at 82% but he had minor desaturations into the high 80s primarily.  He had no significant PLM's.  He had no significant EKG or EEG changes.  Intermittent and mild snoring was noted only.  He had a next day MSLT on 01/17/2020 with a mean sleep latency of 4.6 minutes for 5 naps with 2 sleep onset periods noted, during naps #1 and 3.   Today, 02/02/20: he reports feeling about the same, no new symptoms.  Is not on any Cymbalta or Flexeril.  They recently had a baby about 7 weeks ago.  He reports ongoing struggle with significant daytime somnolence and overwhelming sleepiness at times even while driving.  It does not seem to be as intense or problematic first thing in  the morning, he has to be at work early.  He remembers trying modafinil a few days and the first day it really helped but few days later it did not help.     The patient's allergies, current medications, family history, past medical history, past social history, past surgical history and problem list were reviewed and updated as appropriate.   Previously:   12/01/19: (He) reports a longstanding history of daytime somnolence, at times uncontrollable sleepiness which overcomes him suddenly.  He has fallen asleep at work while working at [employer] and he has fallen asleep at the wheel.  He admits that he can fall asleep during the conversation.  He has done this for years.  Sleepiness has been ongoing for at least 10 years.  He used to fall asleep in class all the time but had very good grades.  He is a non-smoker and drinks alcohol on the desk computer.  I reviewed your office note from 10/13/2019.    His Epworth sleepiness score is 23 out of 24.  He is not aware of any family history of OSA or narcolepsy.  He denies any telltale symptoms of sleep paralysis or hypnagogic or hypnopompic hallucinations and does not typically driven his naps and does not really recall dreaming at night.  He does not have night to night nocturia or recurrent morning headaches and does not believe he snores, girlfriend does  not complain about it.  He lives with his girlfriend, they are expecting a baby in about 2 weeks, he also has a son age 41 from a previous relationship.  He sees his son every other week.  Patient works as a Freight forwarder for [employer].  He has no episodes of gasping for air, has not been told that he has apneic pauses while asleep.  He does not endorse any cataplexy but has had episodes of feeling weaker in his breathing with shortness of breath when he wakes up suddenly or is awoken suddenly.  He denies taking any prescription medications currently.  He no longer takes Cymbalta and no longer takes Flexeril which was  started by his foot doctor.  He has tried Provigil recently, took some from his girlfriend's uncle.  He reports that he had success staying awake during the day.  He does smoke marijuana from time to time, not on a regular basis, last use about a week and a half ago.  He limits his caffeine.  Typically he drinks 1 cup of coffee and 1 or 2 servings of tea per day, occasional diet energy drink.  Does not smoke cigarettes, bedtime is around 8 and rise time around 3:30 AM.  They have 2 dogs in the household.  He has the TV on at night in the bedroom, he does not care for it, girlfriend likes to watch it.  It does not bother him.  His Past Medical History Is Significant For: Past Medical History:  Diagnosis Date  . Acid reflux   . Anxiety   . Flat foot     His Past Surgical History Is Significant For: Past Surgical History:  Procedure Laterality Date  . ANKLE SURGERY  2010    His Family History Is Significant For: Family History  Problem Relation Age of Onset  . Prostate cancer Paternal Grandfather   . Kidney Stones Other   . Kidney disease Neg Hx     His Social History Is Significant For: Social History   Socioeconomic History  . Marital status: Single    Spouse name: Not on file  . Number of children: Not on file  . Years of education: Not on file  . Highest education level: Not on file  Occupational History  . Not on file  Tobacco Use  . Smoking status: Never Smoker  . Smokeless tobacco: Never Used  Substance and Sexual Activity  . Alcohol use: Yes    Alcohol/week: 0.0 standard drinks  . Drug use: Yes    Types: Marijuana    Comment: marijuana  . Sexual activity: Not on file    Comment: twice daily  Other Topics Concern  . Not on file  Social History Narrative  . Not on file   Social Determinants of Health   Financial Resource Strain:   . Difficulty of Paying Living Expenses: Not on file  Food Insecurity:   . Worried About Charity fundraiser in the Last Year: Not  on file  . Ran Out of Food in the Last Year: Not on file  Transportation Needs:   . Lack of Transportation (Medical): Not on file  . Lack of Transportation (Non-Medical): Not on file  Physical Activity:   . Days of Exercise per Week: Not on file  . Minutes of Exercise per Session: Not on file  Stress:   . Feeling of Stress : Not on file  Social Connections:   . Frequency of Communication with Friends and  Family: Not on file  . Frequency of Social Gatherings with Friends and Family: Not on file  . Attends Religious Services: Not on file  . Active Member of Clubs or Organizations: Not on file  . Attends Archivist Meetings: Not on file  . Marital Status: Not on file    His Allergies Are:  Allergies  Allergen Reactions  . Cymbalta [Duloxetine Hcl] Other (See Comments)    Patient reports that he did not like the way the medication made him feel afterwards.   :   His Current Medications Are:  Outpatient Encounter Medications as of 02/02/2020  Medication Sig  . ammonium lactate (AMLACTIN) 12 % lotion Apply 1 application topically as needed for dry skin. (Patient taking differently: Apply 1 application topically as needed for dry skin. PRN)  . Solriamfetol HCl (SUNOSI) 75 MG TABS Take 75 mg by mouth daily. (Patient taking differently: Take 75 mg by mouth daily. LOT [id number]  Exp 11/21)  . Solriamfetol HCl (SUNOSI) 75 MG TABS Take 75 mg by mouth daily.  . [DISCONTINUED] cyclobenzaprine (FLEXERIL) 10 MG tablet Take 1 tablet (10 mg total) by mouth 3 (three) times daily as needed for muscle spasms.  . [DISCONTINUED] meloxicam (MOBIC) 15 MG tablet Take 1 tablet (15 mg total) by mouth daily.  . [DISCONTINUED] promethazine (PHENERGAN) 25 MG tablet Take 25 mg by mouth every 6 (six) hours as needed.   No facility-administered encounter medications on file as of 02/02/2020.  :  Review of Systems:  Out of a complete 14 point review of systems, all are reviewed and negative with the  exception of these symptoms as listed below:  Review of Systems  Neurological:       Here to f/u on recent sleep study and treatment for narcolepsy.    Objective:  Neurological Exam  Physical Exam Physical Examination:   Vitals:   02/02/20 1301  BP: 120/64  Pulse: 84  SpO2: 95%    General Examination: The patient is a very pleasant 29 y.o. male in no acute distress. He appears well-developed and well-nourished and well groomed.   HEENT:Normocephalic, atraumatic, pupils are equal, round and reactive to light, extraocular tracking is good without limitation to gaze excursion or nystagmus noted. Hearing is grossly intact. Face is symmetric with normal facial animation. Speech is clear with no dysarthria noted. There is no hypophonia. There is no lip, neck/head, jaw or voice tremor. Neck is supple with full range of passive and active motion. There are no carotid bruits on auscultation. Oropharynx exam reveals: mildmouth dryness, gooddental hygiene and mildairway crowding, due to smaller airway entry. Tongue protrudes centrally and palate elevates symmetrically.   Chest:Clear to auscultation without wheezing, rhonchi or crackles noted.  Heart:S1+S2+0, regular and normal without murmurs, rubs or gallops noted.   Abdomen:Soft, non-tender and non-distended.  Extremities:There isnopitting edema in the distal lower extremities bilaterally.   Skin: Warm and dry without trophic changes noted.   Musculoskeletal: exam reveals no obvious joint deformities, tenderness or joint swelling or erythema.   Neurologically:  Mental status: The patient is awake, alert and oriented in all 4 spheres.Hisimmediate and remote memory, attention, language skills and fund of knowledge are appropriate. There is no evidence of aphasia, agnosia, apraxia or anomia. Speech is clear with normal prosody and enunciation. Thought process is linear. Mood is normaland affect is normal.  Cranial  nerves II - XII are as described above under HEENT exam.  Motor exam: Normal bulk, strength and tone is notedgrossly.  Cerebellar testing: No dysmetria or intention tremor.  Gait, station and balance:Hestands easily. No veering to one side is noted. No leaning to one side is noted. Posture is age-appropriate and stance is narrow based. Gait showsnormalstride length and normalpace. No problems turning are noted.   Assessmentand Plan:  In summary,Kano T Clineis a very pleasant 48 year oldmale with an underlying medical history of gait disorder, foot pain, anxiety, reflux and obesity, who presents for Follow-up consultation of his daytime somnolence of several years duration.  His history, examination, and most recently, his polysomnographic evaluation with a nighttime sleep study followed by a daytime sleep study are supportive of narcolepsy without cataplexy.  He has ongoing issues with daytime somnolence which can affect his ability to drive.  He is advised regarding his test results and we had a lengthy discussion today about medication options, we discussed symptomatic treatment options with weight promoting agents, nonstimulant and stimulant kind.  We talked about Provigil, Nuvigil, both of generic, we talked about stimulant type medications and the downsides and benefits of these different medications and potential side effects.  We also talked about Xyrem and Donney Rankins, which is a newer medication.  We also talked about [place] and Wakix.  I suggested we proceed with a trial of Sunosi 75 mg strength half a pill once daily for the first 6 days and then 1 pill daily thereafter, we can further increase this to 150 mg daily.  He was given written instructions, information material, a co-pay card, samples of the 75 mg strength, #14, and we talked about expectations and potential side effects.  He is advised to keep his sleep schedule study, try to stay well-hydrated, limit caffeine, pursue healthy  lifestyle, and keep [place] posted as to his symptoms by phone call.  He is advised to follow-up routinely in 3 months in this clinic, sooner if needed.  I answered all his questions today and he was in agreement.   I spent 40 minutes in total face-to-face time and in reviewing records during pre-charting, more than 50% of which was spent in counseling and coordination of care, reviewing test results, reviewing medications and treatment regimen and/or in discussing or reviewing the diagnosis of narcolepsy, the prognosis and treatment options. Pertinent laboratory and imaging test results that were available during this visit with the patient were reviewed by me and considered in my medical decision making (see chart for details).

## 2020-02-07 ENCOUNTER — Telehealth: Payer: Self-pay

## 2020-02-07 NOTE — Telephone Encounter (Signed)
PA for [facility] has been submitted via cover my meds to [facility].  (Key: [id number])  Rx #: [id number] Your information has been sent to [facility].

## 2020-02-08 MED ORDER — MODAFINIL 100 MG PO TABS: ORAL_TABLET | ORAL | 5 refills | Status: DC

## 2020-02-08 NOTE — Telephone Encounter (Signed)
Good question: I think it would be a good idea for him to start and finish the samples for Sunosi before starting modafinil so he can see if the sunosi would be helpful at a later date.  That gives [place] good feedback for future use.  He can switch over to the modafinil once he finishes the Sunosi samples.

## 2020-02-08 NOTE — Telephone Encounter (Signed)
Please notify patient about the insurance denial for [facility].  We had talked about potentially trying modafinil or armodafinil.  Please advise him to start generic Provigil 100 mg: 1 pill up to 2 times a day as needed.  He can decide when to take his first dose, he is typically at work early.  He can take his first dose early and second dose around 10 AM or lunchtime.  Please remind him to avoid the second dose after 3 PM, due to possibility of insomnia at night. Side effects include (but are not limited to): high blood pressure, headache, nervousness, palpitations, GI upset, tremor.

## 2020-02-08 NOTE — Addendum Note (Signed)
Addended by: Huston Foley on: 02/08/2020 02:21 PM   Modules accepted: Orders

## 2020-02-08 NOTE — Telephone Encounter (Signed)
PA for Sunosi has been denied. The denial was made based off the fact the pt has not tried and failed generic modafinil, armodafinil, methylphenidate or amphetamine.   Will fwd to MD to review.

## 2020-02-10 ENCOUNTER — Telehealth: Payer: Self-pay

## 2020-02-10 NOTE — Telephone Encounter (Addendum)
PA for Modafinil has been submitted to [facility] [facility] via cover my meds.  (Key: [id number])  Your information has been sent to [facility]. [facility] is reviewing your PA request. Typically an electronic response will be received within 72 hours. To check for an update later, open this request from your dashboard.  You may close this dialog and return to your dashboard to perform other tasks.

## 2020-02-14 ENCOUNTER — Telehealth: Payer: Self-pay | Admitting: Neurology

## 2020-02-14 NOTE — Telephone Encounter (Signed)
Attempted to reach the pt. Vm was full and not accepting new messages.  Will try again at a later time.

## 2020-02-14 NOTE — Telephone Encounter (Signed)
Pt called back and we discussed Dr. Johny Sax message.  He will begin taking the modafinil tomorrow.

## 2020-02-14 NOTE — Telephone Encounter (Signed)
Pt called stating that the Solriamfetol HCl (SUNOSI) 75 MG TABS is not being effective and is needing to discuss with RN or Provider.

## 2020-02-14 NOTE — Telephone Encounter (Signed)
He can start the Modafinil as soon as he has filled it. If he notices no benefit from the [facility], he can start the modafinil as soon as possible, does not have to finish the sample of Sunosi.

## 2020-02-14 NOTE — Telephone Encounter (Signed)
I reached out to the pt.  He reports since starting the [facility] he has not seen a benefit and has not improved with his sleepiness. Pt reports he has two days left of the sample we provided and wanted to know what his next step would be? I advised Dr. Frances Furbish is recommending we change to modafinil 100 mg bid avoiding the second dosage after 3 pm.  Pt was advised this medication was recommended after insurance denied coverage to the sunosi. Pt is agreeable to trying the Modafinil. Pt was advised to download the good rx card for a cheaper co payment.   Pt reports he has taken his Sunosi dosage today and wanted to know if it would be safe for him to start the modafinil tomorrow?

## 2020-02-21 ENCOUNTER — Telehealth: Payer: Self-pay | Admitting: Neurology

## 2020-02-21 NOTE — Telephone Encounter (Signed)
Dr. Frances Furbish is out of the office today and will be back tomorrow. Will discuss with her once she returns to the clinic.

## 2020-02-21 NOTE — Telephone Encounter (Signed)
Pt called stating that the modafinil (PROVIGIL) 100 MG tablet is not working for him and he would like to speak to the RN or provider to discuss a different option. Please advise.

## 2020-02-21 NOTE — Telephone Encounter (Signed)
(  Key: [id number])  This request has received a Favorable outcome.  Please note any additional information provided by [facility] at the bottom of your screen.  You will also receive a faxed copy of the determination.' Request Reference Number: [id number]. MODAFINIL TAB 100MG  is approved through 02/09/2021. Your patient may now fill this prescription and it will be covered.

## 2020-02-22 MED ORDER — MODAFINIL 100 MG PO TABS
ORAL_TABLET | ORAL | 5 refills | Status: DC
Start: 2020-02-22 — End: 2020-10-31

## 2020-02-22 NOTE — Telephone Encounter (Signed)
I called the pt and we reviewed Dr. Teofilo Pod recommendation. Pt is agreeable to trying 2 pill in the morning and 1 pill at lunch time of the modafinil. Pt will call back within the next week to report how he is doing.

## 2020-02-22 NOTE — Addendum Note (Signed)
Addended by: Huston Foley on: 02/22/2020 07:58 AM   Modules accepted: Orders

## 2020-02-22 NOTE — Telephone Encounter (Signed)
Please advise patient that we have some room to increase the modafinil.  To that end, I have changed the prescription to allow for 2 pills in the morning and 1 at lunchtime.  Prescription was sent to his [facility] in [place].  Please asked him to try to take 2 pills consistently in the morning which would be 200 mg and 1 pill at lunchtime, we could potentially increase the 2nd dose also at a later date but for now I would like to increase gradually.

## 2020-02-22 NOTE — Telephone Encounter (Signed)
Pt has called after trying the modafinil for a week he has not noticed the medication helping his sx. Pt reports fatigue and tiredness is the same and wanted to know if Dr. Frances Furbish had any other recommendations for him to try?

## 2020-04-30 ENCOUNTER — Telehealth: Payer: Self-pay

## 2020-04-30 ENCOUNTER — Ambulatory Visit: Payer: Commercial Managed Care - PPO | Admitting: Neurology

## 2020-04-30 NOTE — Telephone Encounter (Signed)
Pt did not show for their appt with Dr. Athar today.  

## 2020-05-07 ENCOUNTER — Ambulatory Visit: Payer: Commercial Managed Care - PPO | Admitting: Neurology

## 2020-06-23 ENCOUNTER — Other Ambulatory Visit: Payer: Self-pay

## 2020-06-23 DIAGNOSIS — Z20822 Contact with and (suspected) exposure to covid-19: Secondary | ICD-10-CM

## 2020-06-26 LAB — NOVEL CORONAVIRUS, NAA: SARS-CoV-2, NAA: DETECTED — AB

## 2020-09-03 ENCOUNTER — Ambulatory Visit: Payer: Commercial Managed Care - PPO | Admitting: Podiatry

## 2020-10-19 ENCOUNTER — Other Ambulatory Visit: Payer: Self-pay | Admitting: Neurology

## 2020-10-22 NOTE — Telephone Encounter (Signed)
Please reach out to patient so he can make a follow up appointment with NP or myself. We can do a bridge Rx after he has scheduled an appt, to last until the FU appt.

## 2020-10-22 NOTE — Telephone Encounter (Signed)
Pt advised via my chart he needs an appt before we can call his rx in,

## 2020-10-31 ENCOUNTER — Other Ambulatory Visit: Payer: Self-pay | Admitting: Neurology

## 2020-10-31 MED ORDER — MODAFINIL 100 MG PO TABS
ORAL_TABLET | ORAL | 5 refills | Status: DC
Start: 2020-10-31 — End: 2020-11-29

## 2020-10-31 NOTE — Telephone Encounter (Signed)
Pt request refill modafinil (PROVIGIL) 100 MG tablet at [id number]

## 2020-10-31 NOTE — Telephone Encounter (Signed)
Pt has requested the refill of his Modafinil 100 mg. [place] drug registry has been verified. Last refill was 06/06/20 # 60 for a 30 day refill. Will send request to work in doctor since Dr. Frances Furbish is out of the office today.

## 2020-11-27 ENCOUNTER — Telehealth: Payer: Self-pay | Admitting: Neurology

## 2020-11-27 NOTE — Telephone Encounter (Signed)
Pt has new insurance information:   [employer] Member ID: [id number] Group ID: [id number] P  Would call from the nurse if insurance will not cover my medication or MyChart appt scheduled 6/23

## 2020-11-28 NOTE — Telephone Encounter (Signed)
I called the pt and attempted to leave a vm for this ,but VM was full.   PA for Modafinil 100 mg as been submitted.  (Key: [id number]) Form [facility] Standard Drug Request Form Use for [facility] General Prior Authorization Request [phone]  Response should be received within 5 days.   If pt calls back please update on this. Thanks! I will call the pt back once I have heard from the insurance company.

## 2020-11-29 ENCOUNTER — Telehealth (INDEPENDENT_AMBULATORY_CARE_PROVIDER_SITE_OTHER): Payer: Medicaid Other | Admitting: Neurology

## 2020-11-29 ENCOUNTER — Encounter: Payer: Self-pay | Admitting: Neurology

## 2020-11-29 DIAGNOSIS — G47419 Narcolepsy without cataplexy: Secondary | ICD-10-CM

## 2020-11-29 MED ORDER — PROVIGIL 100 MG PO TABS: ORAL_TABLET | ORAL | 5 refills | Status: AC

## 2020-11-29 NOTE — Patient Instructions (Signed)
Given verbally, during today's virtual video-based encounter, with verbal feedback received.   

## 2020-11-29 NOTE — Progress Notes (Signed)
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  The patient is unaccompanied today. He missed an appointment on 04/30/20.  He joins [place] from his home on a laptop computer.  I am located in my office.  I last saw him on 02/02/2020, at which time he reported no particular new symptoms, he was no longer on Cymbalta or Flexeril.  They recently had a baby.  He he reported that modafinil really only helped for 1 day when he tried it through someone else but did not help consistently.  I suggested he start [place].  His insurance denied the authorization for [facility] and he was advised to start modafinil.  We increased the dose in early September.  Today, 11/29/20: He reports being off of the modafinil as he had an insurance change and could not get the prescription filled.  He has been off of it for at least a month and a half at this point, he has noticed increase in his daytime sleepiness.  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  He is currently looking for another job, he had a couple of changes in his insurance in the interim when he changed jobs and now he is on [place].  Medicaid denied generic modafinil.  He had reasonable results with taking modafinil, he was taking 200 mg in the morning and 100 around lunchtime.  He has been staying at home taking care of his now 2-monthold son.  He is without any new complaints.  Staying awake during the day is difficult for him especially off medication.  He has not been on any other prescription medicines at this time.  The patient's allergies, current medications, family history, past medical history, past social history,  past surgical history and problem list were reviewed and updated as appropriate.    Previously:   I first met him on 12/01/2019 at the request of Dr. YKrista Blue at which time he reported a longstanding history of significant daytime somnolence, at times uncontrollable sleepiness.  His Epworth sleepiness score was 23 out of 24.  He was advised to proceed with sleep study testing.  He also had an interim EEG on 01/03/2020 under Dr. YKrista Blue and it was reported as normal in the awake and sleep states.   He had a baseline sleep study, followed by a next day nap study.  I went over his test results with him in detail today. His baseline sleep study from 01/16/2020, showed a sleep efficiency of 93.1%, sleep latency was reduced at 3.5 minutes, REM latency normal at 78 minutes.  He had an increased percentage of slow-wave sleep, REM sleep was high normal at 25.7%.  He did not have any significant sleep disordered breathing and total AHI was 2.3/h.  O2 nadir was briefly at 82% but he had minor desaturations into the high 80s primarily.  He had no significant PLM's.  He had no significant EKG or EEG changes.  Intermittent and mild snoring was noted only.  He had a next day MSLT on 01/17/2020 with a mean sleep latency of 4.6 minutes for 5 naps with 2 sleep onset periods noted, during naps #1 and 3.     12/01/19: (  He) reports a longstanding history of daytime somnolence, at times uncontrollable sleepiness which overcomes him suddenly.  He has fallen asleep at work while working at [employer] and he has fallen asleep at the wheel.  He admits that he can fall asleep during the conversation.  He has done this for years.  Sleepiness has been ongoing for at least 10 years.  He used to fall asleep in class all the time but had very good grades.  He is a non-smoker and drinks alcohol on the desk computer.  I reviewed your office note from 10/13/2019.    His Epworth sleepiness score is 23 out of 24.  He is not aware of any family history of OSA  or narcolepsy.  He denies any telltale symptoms of sleep paralysis or hypnagogic or hypnopompic hallucinations and does not typically driven his naps and does not really recall dreaming at night.  He does not have night to night nocturia or recurrent morning headaches and does not believe he snores, girlfriend does not complain about it.  He lives with his girlfriend, they are expecting a baby in about 2 weeks, he also has a son age 46 from a previous relationship.  He sees his son every other week.  Patient works as a Freight forwarder for [employer].  He has no episodes of gasping for air, has not been told that he has apneic pauses while asleep.  He does not endorse any cataplexy but has had episodes of feeling weaker in his breathing with shortness of breath when he wakes up suddenly or is awoken suddenly.  He denies taking any prescription medications currently.  He no longer takes Cymbalta and no longer takes Flexeril which was started by his foot doctor.  He has tried Provigil recently, took some from his girlfriend's uncle.  He reports that he had success staying awake during the day.  He does smoke marijuana from time to time, not on a regular basis, last use about a week and a half ago.  He limits his caffeine.  Typically he drinks 1 cup of coffee and 1 or 2 servings of tea per day, occasional diet energy drink.  Does not smoke cigarettes, bedtime is around 8 and rise time around 3:30 AM.  They have 2 dogs in the household.  He has the TV on at night in the bedroom, he does not care for it, girlfriend likes to watch it.  It does not bother him.  His Past Medical History Is Significant For: Past Medical History:  Diagnosis Date   Acid reflux    Anxiety    Flat foot     His Past Surgical History Is Significant For: Past Surgical History:  Procedure Laterality Date   ANKLE SURGERY  2010    His Family History Is Significant For: Family History  Problem Relation Age of Onset   Prostate cancer  Paternal Grandfather    Kidney Stones Other    Kidney disease Neg Hx     His Social History Is Significant For: Social History   Socioeconomic History   Marital status: Single    Spouse name: Not on file   Number of children: Not on file   Years of education: Not on file   Highest education level: Not on file  Occupational History   Not on file  Tobacco Use   Smoking status: Never   Smokeless tobacco: Never  Substance and Sexual Activity   Alcohol use: Yes    Alcohol/week:  0.0 standard drinks   Drug use: Yes    Types: Marijuana    Comment: marijuana   Sexual activity: Not on file    Comment: twice daily  Other Topics Concern   Not on file  Social History Narrative   Not on file   Social Determinants of Health   Financial Resource Strain: Not on file  Food Insecurity: Not on file  Transportation Needs: Not on file  Physical Activity: Not on file  Stress: Not on file  Social Connections: Not on file    His Allergies Are:  Allergies  Allergen Reactions   Cymbalta [Duloxetine Hcl] Other (See Comments)    Patient reports that he did not like the way the medication made him feel afterwards.   :   His Current Medications Are:  Outpatient Encounter Medications as of 11/29/2020  Medication Sig   ammonium lactate (AMLACTIN) 12 % lotion Apply 1 application topically as needed for dry skin. (Patient taking differently: Apply 1 application topically as needed for dry skin. PRN)   modafinil (PROVIGIL) 100 MG tablet Take 2 pills in the morning and 1 pill around lunchtime.  Avoid second dose after 3 PM.   Solriamfetol HCl (SUNOSI) 75 MG TABS Take 75 mg by mouth daily. (Patient taking differently: Take 75 mg by mouth daily. LOT [id number]  Exp 11/21)   Solriamfetol HCl (SUNOSI) 75 MG TABS Take 75 mg by mouth daily.   No facility-administered encounter medications on file as of 11/29/2020.  :  Review of Systems:  Out of a complete 14 point review of systems, all are reviewed  and negative with the exception of these symptoms as listed below:  Virtual Visit via Video Note on @ TODAY@  I connected withNAME@ on 11/29/20 at  9:30 AM EDT by a video enabled telemedicine application and verified that I am speaking with the correct person using two identifiers.   I discussed the limitations of evaluation and management by telemedicine and the availability of in person appointments. The patient expressed understanding and agreed to proceed.  History of Present Illness: See above.   Observations/Objective: He is seated, pleasant, conversant, in no acute distress.  Face is symmetric with normal facial animation, speech is clear without dysarthria or hypophonia or voice tremor.  Extraocular movements are well-preserved, no difficulty breathing, speaks in full sentences, comprehension good.    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  History and polysomnographic evaluation were supportive of this diagnosis.  He has been on generic Provigil 200 mg in the morning and 100 at lunchtime but was not able to get a refill for the past month and a half.  He had a couple of changes in his insurance coverage and is now on Medicaid.  We will request insurance authorization for brand-name Provigil.  Previously, we had talked about other symptomatic medication options including Nuvigil, Xyrem and Xywav, which is a newer medication.  We also talked about [place] and Wakix.  He was advised to start a trial of Sunosi in 2021 but his insurance did not cover it.  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  He is looking for a new job.  He is advised to follow-up routinely in this clinic to see one of our nurse practitioners in  6 months and restart Provigil  brand-name 100 mg strength 2 pills in the morning, 1 pill around lunchtime.  He was provided a new prescription and verbal instructions.  I answered all his questions today and he was in agreement with the plan.   Follow Up Instructions:    I discussed the assessment and treatment plan with the patient. The patient was provided an opportunity to ask questions and all were answered. The patient agreed with the plan and demonstrated an understanding of the instructions.   The patient was advised to call back or seek an in-person evaluation if the symptoms worsen or if the condition fails to improve as anticipated.  I provided 20 minutes of non-face-to-face time during this encounter.   Star Age, MD

## 2020-11-29 NOTE — Telephone Encounter (Signed)
Modafinil was denied.  Insurance plan cover brand name Provigil or Nuvigil. Dr. Frances Furbish discussed during 11/29/20 my chart video visit.

## 2020-12-13 ENCOUNTER — Telehealth: Payer: Self-pay | Admitting: Neurology

## 2020-12-13 NOTE — Telephone Encounter (Signed)
Pt called stating that the pharmacy is needing a a PA for the pt's PROVIGIL 100 MG tablet  Please advise.

## 2020-12-14 NOTE — Telephone Encounter (Signed)
Pt called again wanting to know the update on the PA for his Medication.

## 2020-12-17 NOTE — Telephone Encounter (Signed)
Received approval from [facility] ID [id number] Provigil 100mg  quantity 102 duration 34 days.  Approved 12-17-2020 thru 12-17-2021.  [phone].  [facility] fax confirmation received.

## 2020-12-17 NOTE — Telephone Encounter (Addendum)
Initiated CMM Key #Key: [id number] ).  Provigil 100mg .  Take 2 in am and 1 lunch.  #90/ 30 day. G47.419.  has not been on preferred nuvigil or provigil.  Determination awaiting 5 days.

## 2021-05-15 ENCOUNTER — Encounter (INDEPENDENT_AMBULATORY_CARE_PROVIDER_SITE_OTHER): Payer: Self-pay | Admitting: Nurse Practitioner

## 2021-05-15 ENCOUNTER — Ambulatory Visit (INDEPENDENT_AMBULATORY_CARE_PROVIDER_SITE_OTHER): Payer: Medicaid Other | Admitting: Nurse Practitioner

## 2021-05-15 ENCOUNTER — Other Ambulatory Visit: Payer: Self-pay

## 2021-05-15 VITALS — BP 127/77 | HR 80 | Resp 16 | Ht 70.0 in | Wt 253.4 lb

## 2021-05-15 DIAGNOSIS — I8393 Asymptomatic varicose veins of bilateral lower extremities: Secondary | ICD-10-CM | POA: Diagnosis not present

## 2021-05-15 NOTE — Progress Notes (Signed)
Subjective:    Patient ID: Arthur [name], male    DOB: 08-23-1990, 30 y.o.   MRN: [id number] Chief Complaint  Patient presents with   New Patient (Initial Visit)    Ref Ola Spurr varicose veins    Arthur [name] is a 30 year old male that is referred by his primary care provider Dr. Ola Spurr in regards to varicose veins.  He has numerous spider varicosities on the bilateral thighs and some on the cast.  There are a few that are more prominent in nature.  He denies any burning stinging or tenderness of these varicosities.  The patient has a history of standing for long hours in the [employer]  He has previously tried to utilize compression socks but they cause some ankle difficulty for him.  He notes that he has pain more so in his right lower extremity but this is from a previous surgery he had on his foot due to having flatfeet.  The pain varies based on how long he has been active for the day.  He denies any swelling of his lower extremities.  The patient's greatest concern currently is with the parents of the varicosities.  He denies any open wounds or ulcerations.   Review of Systems  Cardiovascular:  Negative for leg swelling.  Hematological:  Bruises/bleeds easily.  All other systems reviewed and are negative.     Objective:   Physical Exam Vitals reviewed.  HENT:     Head: Normocephalic.  Cardiovascular:     Rate and Rhythm: Normal rate.  Pulmonary:     Effort: Pulmonary effort is normal.  Skin:    General: Skin is warm and dry.     Findings: Bruising present.     Comments: Scattered spider varicosities bilaterally  Neurological:     Mental Status: He is alert and oriented to person, place, and time.  Psychiatric:        Mood and Affect: Mood normal.        Behavior: Behavior normal.        Thought Content: Thought content normal.        Judgment: Judgment normal.    BP 127/77 (BP Location: Right Arm)   Pulse 80   Resp 16   Ht 5\' 10"  (1.778 m)   Wt 253  lb 6.4 oz (114.9 kg)   BMI 36.36 kg/m   Past Medical History:  Diagnosis Date   Acid reflux    Anxiety    Flat foot     Social History   Socioeconomic History   Marital status: Single    Spouse name: Not on file   Number of children: Not on file   Years of education: Not on file   Highest education level: Not on file  Occupational History   Not on file  Tobacco Use   Smoking status: Never   Smokeless tobacco: Never  Substance and Sexual Activity   Alcohol use: Yes    Alcohol/week: 0.0 standard drinks   Drug use: Yes    Types: Marijuana    Comment: marijuana   Sexual activity: Not on file    Comment: twice daily  Other Topics Concern   Not on file  Social History Narrative   Not on file   Social Determinants of Health   Financial Resource Strain: Not on file  Food Insecurity: Not on file  Transportation Needs: Not on file  Physical Activity: Not on file  Stress: Not on file  Social Connections: Not on file  Intimate Partner Violence: Not on file    Past Surgical History:  Procedure Laterality Date   ANKLE SURGERY  2010    Family History  Problem Relation Age of Onset   Prostate cancer Paternal Grandfather    Kidney Stones Other    Kidney disease Neg Hx     Allergies  Allergen Reactions   Cymbalta [Duloxetine Hcl] Other (See Comments)    Patient reports that he did not like the way the medication made him feel afterwards.     CBC Latest Ref Rng & Units 10/13/2019  WBC 3.4 - 10.8 x10E3/uL 7.4  Hemoglobin 13.0 - 17.7 g/dL 45.4  Hematocrit 09.8 - 51.0 % 45.4      CMP     Component Value Date/Time   NA 144 10/13/2019 1210   K 4.4 10/13/2019 1210   CL 106 10/13/2019 1210   CO2 25 10/13/2019 1210   GLUCOSE 78 10/13/2019 1210   BUN 12 10/13/2019 1210   CREATININE 0.85 10/13/2019 1210   CALCIUM 9.6 10/13/2019 1210   PROT 6.9 10/13/2019 1210   ALBUMIN 4.4 10/13/2019 1210   AST 22 10/13/2019 1210   ALT 46 (H) 10/13/2019 1210   ALKPHOS 110  10/13/2019 1210   BILITOT 0.4 10/13/2019 1210   GFRNONAA 119 10/13/2019 1210   GFRAA 138 10/13/2019 1210     No results found.     Assessment & Plan:   1. Asymptomatic varicose veins of both lower extremities Given that the patient's varicose veins are currently asymptomatic, we discussed additional imaging and that at this time it is optional.  It is recommended that the patient utilize medical grade compression socks.  This is due to the fact that it is likely his varicose veins will worsen as he ages.  He is recommended to put them on first thing in the morning and remove them before bedtime.  He is also advised to elevate his lower extremities and try to be active 3 to 4 days/week.  If he begins to have pain or discomfort he should also utilize over-the-counter analgesics.  As long as the patient's varicose veins continue to remain asymptomatic no further assessment is necessary however as discussed with patient if he begins to notice more varicosities or if they become tender or painful he should contact our office for further evaluation.   Current Outpatient Medications on File Prior to Visit  Medication Sig Dispense Refill   PROVIGIL 100 MG tablet Take 2 pills in the morning and 1 pill around lunchtime.  Avoid second dose after 3 PM. 90 tablet 5   ammonium lactate (AMLACTIN) 12 % lotion Apply 1 application topically as needed for dry skin. (Patient not taking: Reported on 05/15/2021) 400 g 0   Solriamfetol HCl (SUNOSI) 75 MG TABS Take 75 mg by mouth daily. (Patient not taking: Reported on 05/15/2021) 14 tablet 0   Solriamfetol HCl (SUNOSI) 75 MG TABS Take 75 mg by mouth daily. (Patient not taking: Reported on 05/15/2021) 30 tablet 5   No current facility-administered medications on file prior to visit.    There are no Patient Instructions on file for this visit. No follow-ups on file.   Georgiana Spinner, NP

## 2022-04-07 ENCOUNTER — Encounter (INDEPENDENT_AMBULATORY_CARE_PROVIDER_SITE_OTHER): Payer: Self-pay

## 2022-05-21 DIAGNOSIS — F419 Anxiety disorder, unspecified: Secondary | ICD-10-CM | POA: Diagnosis not present

## 2022-05-21 DIAGNOSIS — F32A Depression, unspecified: Secondary | ICD-10-CM | POA: Diagnosis not present

## 2022-05-21 DIAGNOSIS — F909 Attention-deficit hyperactivity disorder, unspecified type: Secondary | ICD-10-CM | POA: Diagnosis not present

## 2022-05-21 DIAGNOSIS — Z6835 Body mass index (BMI) 35.0-35.9, adult: Secondary | ICD-10-CM | POA: Diagnosis not present

## 2022-05-21 DIAGNOSIS — E6609 Other obesity due to excess calories: Secondary | ICD-10-CM | POA: Diagnosis not present

## 2022-05-21 DIAGNOSIS — Z0000 Encounter for general adult medical examination without abnormal findings: Secondary | ICD-10-CM | POA: Diagnosis not present

## 2022-05-21 DIAGNOSIS — G47419 Narcolepsy without cataplexy: Secondary | ICD-10-CM | POA: Diagnosis not present

## 2022-09-11 DIAGNOSIS — H5213 Myopia, bilateral: Secondary | ICD-10-CM | POA: Diagnosis not present

## 2022-10-15 DIAGNOSIS — H52222 Regular astigmatism, left eye: Secondary | ICD-10-CM | POA: Diagnosis not present

## 2022-11-26 DIAGNOSIS — G47419 Narcolepsy without cataplexy: Secondary | ICD-10-CM | POA: Diagnosis not present

## 2022-11-26 DIAGNOSIS — F32A Depression, unspecified: Secondary | ICD-10-CM | POA: Diagnosis not present

## 2022-11-26 DIAGNOSIS — F908 Attention-deficit hyperactivity disorder, other type: Secondary | ICD-10-CM | POA: Diagnosis not present

## 2022-11-26 DIAGNOSIS — F418 Other specified anxiety disorders: Secondary | ICD-10-CM | POA: Diagnosis not present

## 2023-05-10 DIAGNOSIS — J329 Chronic sinusitis, unspecified: Secondary | ICD-10-CM | POA: Diagnosis not present

## 2023-05-11 DIAGNOSIS — F32A Depression, unspecified: Secondary | ICD-10-CM | POA: Diagnosis not present

## 2023-05-11 DIAGNOSIS — F419 Anxiety disorder, unspecified: Secondary | ICD-10-CM | POA: Diagnosis not present

## 2023-05-11 DIAGNOSIS — F909 Attention-deficit hyperactivity disorder, unspecified type: Secondary | ICD-10-CM | POA: Diagnosis not present

## 2023-05-11 DIAGNOSIS — G47419 Narcolepsy without cataplexy: Secondary | ICD-10-CM | POA: Diagnosis not present

## 2023-05-11 DIAGNOSIS — M79671 Pain in right foot: Secondary | ICD-10-CM | POA: Diagnosis not present

## 2023-05-11 DIAGNOSIS — J069 Acute upper respiratory infection, unspecified: Secondary | ICD-10-CM | POA: Diagnosis not present

## 2023-06-09 DIAGNOSIS — M79671 Pain in right foot: Secondary | ICD-10-CM | POA: Diagnosis not present

## 2023-06-09 DIAGNOSIS — M2142 Flat foot [pes planus] (acquired), left foot: Secondary | ICD-10-CM | POA: Diagnosis not present

## 2023-06-09 DIAGNOSIS — M2141 Flat foot [pes planus] (acquired), right foot: Secondary | ICD-10-CM | POA: Diagnosis not present

## 2023-06-16 DIAGNOSIS — M2141 Flat foot [pes planus] (acquired), right foot: Secondary | ICD-10-CM | POA: Diagnosis not present

## 2023-06-16 DIAGNOSIS — M19072 Primary osteoarthritis, left ankle and foot: Secondary | ICD-10-CM | POA: Diagnosis not present

## 2023-06-16 DIAGNOSIS — M19071 Primary osteoarthritis, right ankle and foot: Secondary | ICD-10-CM | POA: Diagnosis not present

## 2023-06-16 DIAGNOSIS — M2142 Flat foot [pes planus] (acquired), left foot: Secondary | ICD-10-CM | POA: Diagnosis not present

## 2023-06-17 ENCOUNTER — Other Ambulatory Visit: Payer: Self-pay | Admitting: Podiatry

## 2023-06-17 ENCOUNTER — Encounter: Payer: Self-pay | Admitting: Podiatry

## 2023-06-17 DIAGNOSIS — Q6689 Other  specified congenital deformities of feet: Secondary | ICD-10-CM

## 2023-06-19 ENCOUNTER — Inpatient Hospital Stay: Admission: RE | Admit: 2023-06-19 | Payer: Medicaid Other | Source: Ambulatory Visit

## 2023-06-25 ENCOUNTER — Inpatient Hospital Stay: Admission: RE | Admit: 2023-06-25 | Payer: Medicaid Other | Source: Ambulatory Visit

## 2023-06-30 ENCOUNTER — Inpatient Hospital Stay: Admission: RE | Admit: 2023-06-30 | Payer: Medicaid Other | Source: Ambulatory Visit

## 2023-07-09 ENCOUNTER — Ambulatory Visit
Admission: RE | Admit: 2023-07-09 | Discharge: 2023-07-09 | Disposition: A | Discharge status: Home / Self Care | Payer: Medicaid Other | Source: Ambulatory Visit | Attending: Podiatry | Admitting: Podiatry

## 2023-07-09 DIAGNOSIS — Q6689 Other  specified congenital deformities of feet: Secondary | ICD-10-CM

## 2023-07-09 DIAGNOSIS — M19071 Primary osteoarthritis, right ankle and foot: Secondary | ICD-10-CM | POA: Diagnosis not present

## 2023-08-11 DIAGNOSIS — F909 Attention-deficit hyperactivity disorder, unspecified type: Secondary | ICD-10-CM | POA: Diagnosis not present

## 2023-08-11 DIAGNOSIS — E66812 Obesity, class 2: Secondary | ICD-10-CM | POA: Diagnosis not present

## 2023-08-11 DIAGNOSIS — F419 Anxiety disorder, unspecified: Secondary | ICD-10-CM | POA: Diagnosis not present

## 2023-08-11 DIAGNOSIS — Z6835 Body mass index (BMI) 35.0-35.9, adult: Secondary | ICD-10-CM | POA: Diagnosis not present

## 2023-08-11 DIAGNOSIS — E6609 Other obesity due to excess calories: Secondary | ICD-10-CM | POA: Diagnosis not present

## 2023-08-11 DIAGNOSIS — G47419 Narcolepsy without cataplexy: Secondary | ICD-10-CM | POA: Diagnosis not present

## 2023-08-11 DIAGNOSIS — Z0000 Encounter for general adult medical examination without abnormal findings: Secondary | ICD-10-CM | POA: Diagnosis not present

## 2023-08-11 DIAGNOSIS — F32A Depression, unspecified: Secondary | ICD-10-CM | POA: Diagnosis not present

## 2023-09-28 DIAGNOSIS — H15103 Unspecified episcleritis, bilateral: Secondary | ICD-10-CM | POA: Diagnosis not present

## 2023-10-27 DIAGNOSIS — H52223 Regular astigmatism, bilateral: Secondary | ICD-10-CM | POA: Diagnosis not present

## 2023-10-27 DIAGNOSIS — H5213 Myopia, bilateral: Secondary | ICD-10-CM | POA: Diagnosis not present

## 2024-02-15 DIAGNOSIS — F909 Attention-deficit hyperactivity disorder, unspecified type: Secondary | ICD-10-CM | POA: Diagnosis not present

## 2024-02-15 DIAGNOSIS — F419 Anxiety disorder, unspecified: Secondary | ICD-10-CM | POA: Diagnosis not present

## 2024-02-15 DIAGNOSIS — E66812 Obesity, class 2: Secondary | ICD-10-CM | POA: Diagnosis not present

## 2024-02-15 DIAGNOSIS — Z6835 Body mass index (BMI) 35.0-35.9, adult: Secondary | ICD-10-CM | POA: Diagnosis not present

## 2024-02-15 DIAGNOSIS — F32A Depression, unspecified: Secondary | ICD-10-CM | POA: Diagnosis not present

## 2024-02-15 DIAGNOSIS — E6609 Other obesity due to excess calories: Secondary | ICD-10-CM | POA: Diagnosis not present

## 2024-02-15 DIAGNOSIS — G47419 Narcolepsy without cataplexy: Secondary | ICD-10-CM | POA: Diagnosis not present

## 2024-02-16 DIAGNOSIS — G47419 Narcolepsy without cataplexy: Secondary | ICD-10-CM | POA: Diagnosis not present

## 2024-02-16 DIAGNOSIS — F32A Depression, unspecified: Secondary | ICD-10-CM | POA: Diagnosis not present

## 2024-02-16 DIAGNOSIS — Z6835 Body mass index (BMI) 35.0-35.9, adult: Secondary | ICD-10-CM | POA: Diagnosis not present

## 2024-02-16 DIAGNOSIS — F419 Anxiety disorder, unspecified: Secondary | ICD-10-CM | POA: Diagnosis not present

## 2024-02-16 DIAGNOSIS — E6609 Other obesity due to excess calories: Secondary | ICD-10-CM | POA: Diagnosis not present

## 2024-02-16 DIAGNOSIS — F909 Attention-deficit hyperactivity disorder, unspecified type: Secondary | ICD-10-CM | POA: Diagnosis not present

## 2024-02-16 DIAGNOSIS — E66812 Obesity, class 2: Secondary | ICD-10-CM | POA: Diagnosis not present

## 2024-03-31 DIAGNOSIS — F909 Attention-deficit hyperactivity disorder, unspecified type: Secondary | ICD-10-CM | POA: Diagnosis not present

## 2024-03-31 DIAGNOSIS — F32A Depression, unspecified: Secondary | ICD-10-CM | POA: Diagnosis not present

## 2024-03-31 DIAGNOSIS — F419 Anxiety disorder, unspecified: Secondary | ICD-10-CM | POA: Diagnosis not present
